# Patient Record
Sex: Male | Born: 1980 | Race: White | Hispanic: No | Marital: Single | Smoking: Never smoker
Health system: Southern US, Community
[De-identification: ages and names within clinical notes are randomized; demographics above are authoritative.]

## PROBLEM LIST (undated history)

## (undated) DIAGNOSIS — F1010 Alcohol abuse, uncomplicated: Secondary | ICD-10-CM

## (undated) DIAGNOSIS — M542 Cervicalgia: Secondary | ICD-10-CM

## (undated) DIAGNOSIS — B1920 Unspecified viral hepatitis C without hepatic coma: Secondary | ICD-10-CM

## (undated) DIAGNOSIS — Z720 Tobacco use: Secondary | ICD-10-CM

## (undated) DIAGNOSIS — G8929 Other chronic pain: Secondary | ICD-10-CM

## (undated) DIAGNOSIS — F1910 Other psychoactive substance abuse, uncomplicated: Secondary | ICD-10-CM

---

## 2016-11-01 ENCOUNTER — Inpatient Hospital Stay (HOSPITAL_COMMUNITY)
Admission: EM | Admit: 2016-11-01 | Discharge: 2016-11-04 | DRG: 918 | Disposition: A | Discharge status: Other Acute Inpatient Hospital | Payer: Federal, State, Local not specified - Other | Attending: Internal Medicine | Admitting: Internal Medicine

## 2016-11-01 ENCOUNTER — Encounter (HOSPITAL_COMMUNITY): Payer: Self-pay

## 2016-11-01 DIAGNOSIS — R001 Bradycardia, unspecified: Secondary | ICD-10-CM | POA: Diagnosis present

## 2016-11-01 DIAGNOSIS — T391X1A Poisoning by 4-Aminophenol derivatives, accidental (unintentional), initial encounter: Secondary | ICD-10-CM | POA: Diagnosis present

## 2016-11-01 DIAGNOSIS — T391X2A Poisoning by 4-Aminophenol derivatives, intentional self-harm, initial encounter: Principal | ICD-10-CM | POA: Diagnosis present

## 2016-11-01 DIAGNOSIS — Z720 Tobacco use: Secondary | ICD-10-CM | POA: Diagnosis present

## 2016-11-01 DIAGNOSIS — F1010 Alcohol abuse, uncomplicated: Secondary | ICD-10-CM | POA: Diagnosis present

## 2016-11-01 DIAGNOSIS — F1210 Cannabis abuse, uncomplicated: Secondary | ICD-10-CM | POA: Diagnosis present

## 2016-11-01 DIAGNOSIS — T50901A Poisoning by unspecified drugs, medicaments and biological substances, accidental (unintentional), initial encounter: Secondary | ICD-10-CM | POA: Diagnosis present

## 2016-11-01 DIAGNOSIS — T402X2A Poisoning by other opioids, intentional self-harm, initial encounter: Secondary | ICD-10-CM | POA: Diagnosis present

## 2016-11-01 DIAGNOSIS — F17200 Nicotine dependence, unspecified, uncomplicated: Secondary | ICD-10-CM | POA: Diagnosis present

## 2016-11-01 DIAGNOSIS — T50902A Poisoning by unspecified drugs, medicaments and biological substances, intentional self-harm, initial encounter: Secondary | ICD-10-CM

## 2016-11-01 DIAGNOSIS — R7401 Elevation of levels of liver transaminase levels: Secondary | ICD-10-CM

## 2016-11-01 DIAGNOSIS — F1410 Cocaine abuse, uncomplicated: Secondary | ICD-10-CM | POA: Diagnosis present

## 2016-11-01 DIAGNOSIS — F329 Major depressive disorder, single episode, unspecified: Secondary | ICD-10-CM | POA: Diagnosis present

## 2016-11-01 DIAGNOSIS — B1920 Unspecified viral hepatitis C without hepatic coma: Secondary | ICD-10-CM | POA: Diagnosis present

## 2016-11-01 DIAGNOSIS — R7989 Other specified abnormal findings of blood chemistry: Secondary | ICD-10-CM | POA: Diagnosis present

## 2016-11-01 DIAGNOSIS — F1910 Other psychoactive substance abuse, uncomplicated: Secondary | ICD-10-CM | POA: Diagnosis present

## 2016-11-01 DIAGNOSIS — R740 Nonspecific elevation of levels of transaminase and lactic acid dehydrogenase [LDH]: Secondary | ICD-10-CM

## 2016-11-01 DIAGNOSIS — R4589 Other symptoms and signs involving emotional state: Secondary | ICD-10-CM | POA: Diagnosis present

## 2016-11-01 DIAGNOSIS — R945 Abnormal results of liver function studies: Secondary | ICD-10-CM | POA: Diagnosis present

## 2016-11-01 DIAGNOSIS — R4689 Other symptoms and signs involving appearance and behavior: Secondary | ICD-10-CM

## 2016-11-01 HISTORY — DX: Other psychoactive substance abuse, uncomplicated: F19.10

## 2016-11-01 HISTORY — DX: Tobacco use: Z72.0

## 2016-11-01 HISTORY — DX: Unspecified viral hepatitis C without hepatic coma: B19.20

## 2016-11-01 HISTORY — DX: Alcohol abuse, uncomplicated: F10.10

## 2016-11-01 LAB — SALICYLATE LEVEL: Salicylate Lvl: <7 mg/dL (ref 2.8–30.0)

## 2016-11-01 LAB — COMPREHENSIVE METABOLIC PANEL
ALT: 305 U/L — ABNORMAL HIGH (ref 17–63)
AST: 348 U/L — ABNORMAL HIGH (ref 15–41)
Albumin: 4 g/dL (ref 3.5–5.0)
Alkaline Phosphatase: 63 U/L (ref 38–126)
Anion gap: 8 (ref 5–15)
BUN: 7 mg/dL (ref 6–20)
CO2: 31 mmol/L (ref 22–32)
Calcium: 9.3 mg/dL (ref 8.9–10.3)
Chloride: 98 mmol/L — ABNORMAL LOW (ref 101–111)
Creatinine, Ser: 0.95 mg/dL (ref 0.61–1.24)
GFR calc Af Amer: >60 mL/min (ref >60)
GFR calc non Af Amer: >60 mL/min (ref >60)
Glucose, Bld: 91 mg/dL (ref 65–99)
Potassium: 3.9 mmol/L (ref 3.5–5.1)
Sodium: 137 mmol/L (ref 135–145)
Total Bilirubin: 1.3 mg/dL — ABNORMAL HIGH (ref 0.3–1.2)
Total Protein: 6.2 g/dL — ABNORMAL LOW (ref 6.5–8.1)

## 2016-11-01 LAB — CBC
HCT: 43.5 % (ref 39.0–52.0)
Hemoglobin: 14.3 g/dL (ref 13.0–17.0)
MCH: 29.3 pg (ref 26.0–34.0)
MCHC: 32.9 g/dL (ref 30.0–36.0)
MCV: 89.1 fL (ref 78.0–100.0)
Platelets: 206 10*3/uL (ref 150–400)
RBC: 4.88 MIL/uL (ref 4.22–5.81)
RDW: 12.3 % (ref 11.5–15.5)
WBC: 5.8 10*3/uL (ref 4.0–10.5)

## 2016-11-01 LAB — RAPID URINE DRUG SCREEN, HOSP PERFORMED
Amphetamines: NOT DETECTED
Barbiturates: NOT DETECTED
Benzodiazepines: POSITIVE — AB
Cocaine: POSITIVE — AB
Opiates: NOT DETECTED
Tetrahydrocannabinol: POSITIVE — AB

## 2016-11-01 LAB — ETHANOL: Alcohol, Ethyl (B): <5 mg/dL (ref <5)

## 2016-11-01 LAB — ACETAMINOPHEN LEVEL: Acetaminophen (Tylenol), Serum: <10 ug/mL — ABNORMAL LOW (ref 10–30)

## 2016-11-01 LAB — CK: Total CK: 52 U/L (ref 49–397)

## 2016-11-01 NOTE — ED Notes (Signed)
ED Provider at bedside. 

## 2016-11-01 NOTE — ED Notes (Signed)
Staffing called for sitter and security called to be wanded

## 2016-11-01 NOTE — ED Provider Notes (Signed)
[facility]-EMERGENCY DEPT Provider Note   CSN: [id number] Arrival date & time: 11/01/16  1831  By signing my name below, I, George Quinn, attest that this documentation has been prepared under the direction and in the presence of George Palumbo, MD  Electronically Signed: Thelma BargeNick Quinn, Scribe. 11/01/16. 11:29 PM.  History   Chief Complaint Chief Complaint  Patient presents with  . Drug Overdose   The history is provided by the patient. No language interpreter was used.  Ingestion  This is a new problem. The current episode started more than 2 days ago. The problem occurs constantly. The problem has been gradually improving. Pertinent negatives include no chest pain and no shortness of breath. Nothing aggravates the symptoms. Nothing relieves the symptoms. He has tried nothing for the symptoms. The treatment provided significant relief.    HPI Comments: George Quinn is a 36 y.o. male who presents to the Emergency Department complaining of a drug overdose that occurred on 10-27-16. He states he took a 20 pills of percocet 5mg  and an entire bottle of generic sleeping pills. He is requesting to know if the medications have left his system. He states he fell asleep and just woke up today in a ditch and reports bug bites to his legs. He denies vomiting. History reviewed. No pertinent past medical history.  There are no active problems to display for this patient.   History reviewed. No pertinent surgical history.     Home Medications    Prior to Admission medications   Not on File    Family History No family history on file.  Social History Social History  Substance Use Topics  . Smoking status: Current Every Day Smoker  . Smokeless tobacco: Never Used  . Alcohol use Yes     Allergies   Patient has no known allergies.   Review of Systems Review of Systems  Constitutional: Negative for appetite change, chills and fever.  HENT: Negative for drooling and facial swelling.     Eyes: Negative for photophobia.  Respiratory: Negative for shortness of breath.   Cardiovascular: Negative for chest pain, palpitations and leg swelling.  Gastrointestinal: Negative for anal bleeding and vomiting.  Genitourinary: Negative for difficulty urinating.  Musculoskeletal: Negative for neck stiffness.  Skin: Negative for pallor.  Neurological: Negative for facial asymmetry and speech difficulty.  Psychiatric/Behavioral: Positive for behavioral problems, self-injury and suicidal ideas.  All other systems reviewed and are negative.    Physical Exam Updated Vital Signs BP (!) 152/71 (BP Location: Right Arm)   Pulse (!) 46   Temp 98.1 F (36.7 C) (Oral)   Resp 20   SpO2 99%   Physical Exam  Constitutional: He is oriented to person, place, and time. He appears well-developed and well-nourished.  HENT:  Head: Normocephalic and atraumatic.  Nose: Nose normal.  Eyes: EOM are normal.  Neck: Normal range of motion. Neck supple.  Cardiovascular: Normal rate, regular rhythm, normal heart sounds and intact distal pulses.  Exam reveals no gallop and no friction rub.   No murmur heard. Pulmonary/Chest: Effort normal and breath sounds normal. No respiratory distress. He has no wheezes. He has no rales.  Abdominal: Soft. Bowel sounds are normal. He exhibits no distension. There is no tenderness. There is no rebound and no guarding.  Musculoskeletal: Normal range of motion.  Neurological: He is alert and oriented to person, place, and time.  Skin: Skin is warm and dry. Capillary refill takes less than 2 seconds.  No urine burns Skin warm,  dry, intact, all compartments soft   Psychiatric: He has a normal mood and affect. Judgment normal.  Nursing note and vitals reviewed.    ED Treatments / Results  DIAGNOSTIC STUDIES: Oxygen Saturation is 99% on RA, normal by my interpretation.    COORDINATION OF CARE: 11:28 PM Discussed treatment plan with pt at bedside and pt agreed to  plan.  @1 :25 am pt will be admitted to Dr. Clyde LundborgNiu. Labs (all labs ordered are listed, but only abnormal results are displayed)  Results for orders placed or performed during the hospital encounter of 11/01/16  Comprehensive metabolic panel  Result Value Ref Range   Sodium 137 135 - 145 mmol/L   Potassium 3.9 3.5 - 5.1 mmol/L   Chloride 98 (L) 101 - 111 mmol/L   CO2 31 22 - 32 mmol/L   Glucose, Bld 91 65 - 99 mg/dL   BUN 7 6 - 20 mg/dL   Creatinine, Ser 1.610.95 0.61 - 1.24 mg/dL   Calcium 9.3 8.9 - 09.610.3 mg/dL   Total Protein 6.2 (L) 6.5 - 8.1 g/dL   Albumin 4.0 3.5 - 5.0 g/dL   AST 045348 (H) 15 - 41 U/L   ALT 305 (H) 17 - 63 U/L   Alkaline Phosphatase 63 38 - 126 U/L   Total Bilirubin 1.3 (H) 0.3 - 1.2 mg/dL   GFR calc non Af Amer >60 >60 mL/min   GFR calc Af Amer >60 >60 mL/min   Anion gap 8 5 - 15  Ethanol  Result Value Ref Range   Alcohol, Ethyl (B) <5 <5 mg/dL  Salicylate level  Result Value Ref Range   Salicylate Lvl <7.0 2.8 - 30.0 mg/dL  Acetaminophen level  Result Value Ref Range   Acetaminophen (Tylenol), Serum <10 (L) 10 - 30 ug/mL  cbc  Result Value Ref Range   WBC 5.8 4.0 - 10.5 K/uL   RBC 4.88 4.22 - 5.81 MIL/uL   Hemoglobin 14.3 13.0 - 17.0 g/dL   HCT 40.943.5 81.139.0 - 91.452.0 %   MCV 89.1 78.0 - 100.0 fL   MCH 29.3 26.0 - 34.0 pg   MCHC 32.9 30.0 - 36.0 g/dL   RDW 78.212.3 95.611.5 - 21.315.5 %   Platelets 206 150 - 400 K/uL  Rapid urine drug screen (hospital performed)  Result Value Ref Range   Opiates NONE DETECTED NONE DETECTED   Cocaine POSITIVE (A) NONE DETECTED   Benzodiazepines POSITIVE (A) NONE DETECTED   Amphetamines NONE DETECTED NONE DETECTED   Tetrahydrocannabinol POSITIVE (A) NONE DETECTED   Barbiturates NONE DETECTED NONE DETECTED  CK  Result Value Ref Range   Total CK 52 49 - 397 U/L   No results found.  EKG  EKG Interpretation  Date/Time:  Wednesday November 01 2016 23:30:56 EDT Ventricular Rate:  52 PR Interval:    QRS Duration: 90 QT  Interval:  425 QTC Calculation: 396 R Axis:   99 Text Interpretation:  Sinus rhythm Confirmed by Nicanor AlconPalumbo, George ([id number]) on 11/01/2016 11:57:13 PM       Radiology No results found.  Procedures Procedures (including critical care time)  Medications Ordered in ED  Medications  acetylcysteine (ACETADOTE) 40 mg/mL load via infusion 9,525 mg (not administered)    Followed by  acetylcysteine (ACETADOTE) 40,000 mg in dextrose 5 % 1,000 mL (40 mg/mL) infusion (not administered)  0.9 %  sodium chloride infusion (not administered)  ondansetron (ZOFRAN) injection 4 mg (not administered)  zolpidem (AMBIEN) tablet 5 mg (not administered)  nicotine (NICODERM  CQ - dosed in mg/24 hours) patch 21 mg (not administered)  LORazepam (ATIVAN) tablet 1 mg (not administered)    Or  LORazepam (ATIVAN) injection 1 mg (not administered)  thiamine (VITAMIN B-1) tablet 100 mg (not administered)    Or  thiamine (B-1) injection 100 mg (not administered)  folic acid (FOLVITE) tablet 1 mg (not administered)  multivitamin with minerals tablet 1 tablet (not administered)  LORazepam (ATIVAN) injection 0-4 mg (not administered)    Followed by  LORazepam (ATIVAN) injection 0-4 mg (not administered)  enoxaparin (LOVENOX) injection 40 mg (not administered)     Final Clinical Impressions(s) / ED Diagnoses  Intentional overdose: will need to see psychiatry once medically cleared on NAC in 24 hours.    I personally performed the services described in this documentation, which was scribed in my presence. The recorded information has been reviewed and is accurate.      Quinn, April, MD 11/02/16 [phone]

## 2016-11-01 NOTE — ED Triage Notes (Signed)
Pt reports that he "thinks" Friday night he took a bottle of percocet and followed it with a fifth of vodka and he woke up yesterday morning and called [facility] they told him to come here to make sure that he does not have anymore drugs in his system. The patient denies any use since then. Alert oriented and ambulatory. Denies any symptoms at all. Confesses to SI Friday denies any SI at this time.

## 2016-11-02 ENCOUNTER — Encounter (HOSPITAL_COMMUNITY): Payer: Self-pay | Admitting: Internal Medicine

## 2016-11-02 ENCOUNTER — Inpatient Hospital Stay (HOSPITAL_COMMUNITY): Payer: Self-pay

## 2016-11-02 DIAGNOSIS — F1910 Other psychoactive substance abuse, uncomplicated: Secondary | ICD-10-CM

## 2016-11-02 DIAGNOSIS — T50902A Poisoning by unspecified drugs, medicaments and biological substances, intentional self-harm, initial encounter: Secondary | ICD-10-CM

## 2016-11-02 DIAGNOSIS — T391X2A Poisoning by 4-Aminophenol derivatives, intentional self-harm, initial encounter: Principal | ICD-10-CM

## 2016-11-02 DIAGNOSIS — R945 Abnormal results of liver function studies: Secondary | ICD-10-CM | POA: Diagnosis present

## 2016-11-02 DIAGNOSIS — B1920 Unspecified viral hepatitis C without hepatic coma: Secondary | ICD-10-CM

## 2016-11-02 DIAGNOSIS — F1010 Alcohol abuse, uncomplicated: Secondary | ICD-10-CM

## 2016-11-02 DIAGNOSIS — R4589 Other symptoms and signs involving emotional state: Secondary | ICD-10-CM | POA: Diagnosis present

## 2016-11-02 DIAGNOSIS — R7989 Other specified abnormal findings of blood chemistry: Secondary | ICD-10-CM | POA: Diagnosis present

## 2016-11-02 DIAGNOSIS — Z720 Tobacco use: Secondary | ICD-10-CM

## 2016-11-02 DIAGNOSIS — R001 Bradycardia, unspecified: Secondary | ICD-10-CM

## 2016-11-02 DIAGNOSIS — T391X1A Poisoning by 4-Aminophenol derivatives, accidental (unintentional), initial encounter: Secondary | ICD-10-CM | POA: Diagnosis present

## 2016-11-02 DIAGNOSIS — R4689 Other symptoms and signs involving appearance and behavior: Secondary | ICD-10-CM

## 2016-11-02 DIAGNOSIS — T50901A Poisoning by unspecified drugs, medicaments and biological substances, accidental (unintentional), initial encounter: Secondary | ICD-10-CM | POA: Diagnosis present

## 2016-11-02 DIAGNOSIS — T1491XA Suicide attempt, initial encounter: Secondary | ICD-10-CM

## 2016-11-02 LAB — HIV ANTIBODY (ROUTINE TESTING W REFLEX): HIV Screen 4th Generation wRfx: NONREACTIVE

## 2016-11-02 LAB — CBC
HCT: 41.3 % (ref 39.0–52.0)
Hemoglobin: 14.2 g/dL (ref 13.0–17.0)
MCH: 29.8 pg (ref 26.0–34.0)
MCHC: 34.4 g/dL (ref 30.0–36.0)
MCV: 86.6 fL (ref 78.0–100.0)
Platelets: 169 10*3/uL (ref 150–400)
RBC: 4.77 MIL/uL (ref 4.22–5.81)
RDW: 11.9 % (ref 11.5–15.5)
WBC: 5.2 10*3/uL (ref 4.0–10.5)

## 2016-11-02 LAB — COMPREHENSIVE METABOLIC PANEL
ALT: 297 U/L — ABNORMAL HIGH (ref 17–63)
AST: 335 U/L — ABNORMAL HIGH (ref 15–41)
Albumin: 3.8 g/dL (ref 3.5–5.0)
Alkaline Phosphatase: 64 U/L (ref 38–126)
Anion gap: 6 (ref 5–15)
BUN: 9 mg/dL (ref 6–20)
CO2: 29 mmol/L (ref 22–32)
Calcium: 8.9 mg/dL (ref 8.9–10.3)
Chloride: 104 mmol/L (ref 101–111)
Creatinine, Ser: 0.85 mg/dL (ref 0.61–1.24)
GFR calc Af Amer: >60 mL/min (ref >60)
GFR calc non Af Amer: >60 mL/min (ref >60)
Glucose, Bld: 102 mg/dL — ABNORMAL HIGH (ref 65–99)
Potassium: 3.6 mmol/L (ref 3.5–5.1)
Sodium: 139 mmol/L (ref 135–145)
Total Bilirubin: 0.6 mg/dL (ref 0.3–1.2)
Total Protein: 5.8 g/dL — ABNORMAL LOW (ref 6.5–8.1)

## 2016-11-02 LAB — PROTIME-INR
INR: 1.08
Prothrombin Time: 14 seconds (ref 11.4–15.2)

## 2016-11-02 LAB — APTT: aPTT: 28 seconds (ref 24–36)

## 2016-11-02 LAB — ACETAMINOPHEN LEVEL: Acetaminophen (Tylenol), Serum: <10 ug/mL — ABNORMAL LOW (ref 10–30)

## 2016-11-02 MED ORDER — ACETYLCYSTEINE LOAD VIA INFUSION
150.0000 mg/kg | Freq: Once | INTRAVENOUS | Status: DC
  Filled 2016-11-02: qty 239

## 2016-11-02 MED ORDER — VITAMIN B-1 100 MG PO TABS
100.0000 mg | ORAL_TABLET | Freq: Every day | ORAL | Status: DC
  Administered 2016-11-02 – 2016-11-04 (×3): 100 mg via ORAL
  Filled 2016-11-02 (×3): qty 1

## 2016-11-02 MED ORDER — LORAZEPAM 1 MG PO TABS: 1.0000 mg | ORAL_TABLET | Freq: Four times a day (QID) | ORAL | Status: DC | PRN

## 2016-11-02 MED ORDER — ADULT MULTIVITAMIN W/MINERALS CH
1.0000 | ORAL_TABLET | Freq: Every day | ORAL | Status: DC
  Administered 2016-11-02 – 2016-11-04 (×3): 1 via ORAL
  Filled 2016-11-02 (×3): qty 1

## 2016-11-02 MED ORDER — DEXTROSE 5 % IV SOLN
15.0000 mg/kg/h | INTRAVENOUS | Status: DC
  Administered 2016-11-02 – 2016-11-03 (×2): 15 mg/kg/h via INTRAVENOUS
  Filled 2016-11-02 (×2): qty 150

## 2016-11-02 MED ORDER — FOLIC ACID 1 MG PO TABS
1.0000 mg | ORAL_TABLET | Freq: Every day | ORAL | Status: DC
  Administered 2016-11-02 – 2016-11-04 (×3): 1 mg via ORAL
  Filled 2016-11-02 (×3): qty 1

## 2016-11-02 MED ORDER — LORAZEPAM 2 MG/ML IJ SOLN
0.0000 mg | Freq: Four times a day (QID) | INTRAMUSCULAR | Status: DC
Start: 2016-11-02 — End: 2016-11-02
  Administered 2016-11-02: 2 mg via INTRAVENOUS
  Administered 2016-11-02: 1 mg via INTRAVENOUS
  Filled 2016-11-02: qty 1

## 2016-11-02 MED ORDER — ADULT MULTIVITAMIN W/MINERALS CH: 1.0000 | ORAL_TABLET | Freq: Every day | ORAL | Status: DC

## 2016-11-02 MED ORDER — LORAZEPAM 2 MG/ML IJ SOLN
1.0000 mg | Freq: Four times a day (QID) | INTRAMUSCULAR | Status: DC | PRN
  Filled 2016-11-02: qty 1

## 2016-11-02 MED ORDER — FOLIC ACID 1 MG PO TABS: 1.0000 mg | ORAL_TABLET | Freq: Every day | ORAL | Status: DC

## 2016-11-02 MED ORDER — SODIUM CHLORIDE 0.9 % IV SOLN
INTRAVENOUS | Status: DC
Start: 2016-11-02 — End: 2016-11-04
  Administered 2016-11-02: 03:00:00 via INTRAVENOUS
  Administered 2016-11-03: 50 mL/h via INTRAVENOUS

## 2016-11-02 MED ORDER — ZOLPIDEM TARTRATE 5 MG PO TABS: 5.0000 mg | ORAL_TABLET | Freq: Every evening | ORAL | Status: DC | PRN

## 2016-11-02 MED ORDER — LORAZEPAM 2 MG/ML IJ SOLN
1.0000 mg | Freq: Four times a day (QID) | INTRAMUSCULAR | Status: DC | PRN
  Administered 2016-11-04: 1 mg via INTRAVENOUS
  Filled 2016-11-02: qty 1

## 2016-11-02 MED ORDER — DEXTROSE 5 % IV SOLN
15.0000 mg/kg/h | INTRAVENOUS | Status: DC
  Filled 2016-11-02: qty 150

## 2016-11-02 MED ORDER — LORAZEPAM 1 MG PO TABS
1.0000 mg | ORAL_TABLET | Freq: Four times a day (QID) | ORAL | Status: DC | PRN
  Administered 2016-11-02: 1 mg via ORAL
  Filled 2016-11-02: qty 1

## 2016-11-02 MED ORDER — LORAZEPAM 2 MG/ML IJ SOLN: 0.0000 mg | Freq: Two times a day (BID) | INTRAMUSCULAR | Status: DC

## 2016-11-02 MED ORDER — DEXTROSE 5 % IV SOLN
15.0000 mg/kg/h | INTRAVENOUS | Status: DC
  Filled 2016-11-02: qty 200

## 2016-11-02 MED ORDER — NICOTINE 21 MG/24HR TD PT24
21.0000 mg | MEDICATED_PATCH | Freq: Every day | TRANSDERMAL | Status: DC
  Filled 2016-11-02 (×2): qty 1

## 2016-11-02 MED ORDER — ONDANSETRON HCL 4 MG/2ML IJ SOLN: 4.0000 mg | Freq: Three times a day (TID) | INTRAMUSCULAR | Status: DC | PRN

## 2016-11-02 MED ORDER — ENOXAPARIN SODIUM 40 MG/0.4ML ~~LOC~~ SOLN
40.0000 mg | SUBCUTANEOUS | Status: DC
  Filled 2016-11-02: qty 0.4

## 2016-11-02 MED ORDER — VITAMIN B-1 100 MG PO TABS: 100.0000 mg | ORAL_TABLET | Freq: Every day | ORAL | Status: DC

## 2016-11-02 MED ORDER — THIAMINE HCL 100 MG/ML IJ SOLN: 100.0000 mg | Freq: Every day | INTRAMUSCULAR | Status: DC

## 2016-11-02 MED ORDER — ACETYLCYSTEINE LOAD VIA INFUSION
150.0000 mg/kg | Freq: Once | INTRAVENOUS | Status: AC
  Administered 2016-11-02: 8865 mg via INTRAVENOUS
  Filled 2016-11-02: qty 222

## 2016-11-02 NOTE — H&P (Signed)
History and Physical    George Quinn WUJ:[id number] DOB: 03/07/1981 DOA: 11/01/2016  Referring MD/NP/PA:   PCP: Patient, No Pcp Per   Patient coming from:  The patient is coming from home.  At baseline, pt is independent for most of ADL.  Chief Complaint: overdose of Percocet  HPI: George Quinn is a 36 y.o. male with medical history significant of polysubstance abuse including tobacco, alcohol, cocaine, marijuana and benzodiazepine, who presents with overdose of Percocet.  Pt states that he took 20 pills of percocet 5 mg and an entire bottle of generic sleeping pills no 10/27/16. He also had fifth of vodka. He states he fell asleep and just woke up yesterday morning. He called [facility], and was told to come to ED to make sure that he does not have anymore drugs in his system. Confesses to SI Friday, but denies any SI or HI at this time. Patient is asymptomatic. He denies chest pain, shortness breath, cough, fever, chills, nausea, vomiting, diarrhea, abdominal pain, symptoms of UTI or unilateral weakness.  ED Course: pt was found to have Tylenol level less than 10, salicylate level less than 7, CK 52, positive UDS for benzodiazepine, cocaine and THC, WBC 5.8, abnormal liver function with AST 348, ALT 305, total bilirubin 1.3, ALP 63, electrolytes renal function okay, temperature normal, bradycardia, oxygen saturation 95% on room air. Patient is placed on telemetry bed for observation. IV acetylcysteine was started per poison control recommendation  Review of Systems:   General: no fevers, chills, no changes in body weight, has fatigue HEENT: no blurry vision, hearing changes or sore throat Respiratory: no dyspnea, coughing, wheezing CV: no chest pain, no palpitations GI: no nausea, vomiting, abdominal pain, diarrhea, constipation GU: no dysuria, burning on urination, increased urinary frequency, hematuria  Ext: no leg edema Neuro: no unilateral weakness, numbness, or tingling, no  vision change or hearing loss Skin: no rash, no skin tear. MSK: No muscle spasm, no deformity, no limitation of range of movement in spin Heme: No easy bruising.  Travel history: No recent long distant travel.  Allergy: No Known Allergies  Past Medical History:  Diagnosis Date  . Alcohol abuse   . HCV (hepatitis C virus)   . Hepatitis C   . Polysubstance abuse   . Tobacco abuse     History reviewed. No pertinent surgical history.  Social History:  reports that he has been smoking.  He has never used smokeless tobacco. He reports that he drinks alcohol. He reports that he uses drugs, including Cocaine and Marijuana.  Family History:  Family History  Problem Relation Age of Onset  . Diabetes Mellitus II Mother   . Thyroid disease Mother      Prior to Admission medications   Not on File    Physical Exam: Vitals:   11/02/16 0030 11/02/16 0106 11/02/16 0257 11/02/16 0335  BP: (!) 132/92  (!) 137/104   Pulse: (!) 46  (!) 45   Resp: 14  15 18   Temp:   97.6 F (36.4 C)   TempSrc:   Oral   SpO2: 96%  98%   Weight:  63.5 kg (140 lb) 59.1 kg (130 lb 4.8 oz)   Height:   5\' 7"  (1.702 m)    General: Not in acute distress. HEENT:       Eyes: PERRL, EOMI, no scleral icterus.       ENT: No discharge from the ears and nose, no pharynx injection, no tonsillar enlargement.  Neck: No JVD, no bruit, no mass felt. Heme: No neck lymph node enlargement. Cardiac: S1/S2, RRR, No murmurs, No gallops or rubs. Respiratory: No rales, wheezing, rhonchi or rubs. GI: Soft, nondistended, nontender, no rebound pain, no organomegaly, BS present. GU: No hematuria Ext: No pitting leg edema bilaterally. 2+DP/PT pulse bilaterally. Musculoskeletal: No joint deformities, No joint redness or warmth, no limitation of ROM in spin. Skin: No rashes.  Neuro: Alert, oriented X3, cranial nerves II-XII grossly intact, moves all extremities normally.  Psych: Patient is not psychotic, had suicidal  ideation. No HI  Labs on Admission: I have personally reviewed following labs and imaging studies  CBC:  Recent Labs Lab 11/01/16 1911 11/02/16 0203  WBC 5.8 5.2  HGB 14.3 14.2  HCT 43.5 41.3  MCV 89.1 86.6  PLT 206 169   Basic Metabolic Panel:  Recent Labs Lab 11/01/16 1911 11/02/16 0137  NA 137 139  K 3.9 3.6  CL 98* 104  CO2 31 29  GLUCOSE 91 102*  BUN 7 9  CREATININE 0.95 0.85  CALCIUM 9.3 8.9   GFR: Estimated Creatinine Clearance: 102.4 mL/min (by C-G formula based on SCr of 0.85 mg/dL). Liver Function Tests:  Recent Labs Lab 11/01/16 1911 11/02/16 0137  AST 348* 335*  ALT 305* 297*  ALKPHOS 63 64  BILITOT 1.3* 0.6  PROT 6.2* 5.8*  ALBUMIN 4.0 3.8   No results for input(s): LIPASE, AMYLASE in the last 168 hours. No results for input(s): AMMONIA in the last 168 hours. Coagulation Profile:  Recent Labs Lab 11/02/16 0137  INR 1.08   Cardiac Enzymes:  Recent Labs Lab 11/01/16 1911  CKTOTAL 52   BNP (last 3 results) No results for input(s): PROBNP in the last 8760 hours. HbA1C: No results for input(s): HGBA1C in the last 72 hours. CBG: No results for input(s): GLUCAP in the last 168 hours. Lipid Profile: No results for input(s): CHOL, HDL, LDLCALC, TRIG, CHOLHDL, LDLDIRECT in the last 72 hours. Thyroid Function Tests: No results for input(s): TSH, T4TOTAL, FREET4, T3FREE, THYROIDAB in the last 72 hours. Anemia Panel: No results for input(s): VITAMINB12, FOLATE, FERRITIN, TIBC, IRON, RETICCTPCT in the last 72 hours. Urine analysis: No results found for: COLORURINE, APPEARANCEUR, LABSPEC, PHURINE, GLUCOSEU, HGBUR, BILIRUBINUR, KETONESUR, PROTEINUR, UROBILINOGEN, NITRITE, LEUKOCYTESUR Sepsis Labs: @LABRCNTIP (procalcitonin:4,lacticidven:4) )No results found for this or any previous visit (from the past 240 hour(s)).   Radiological Exams on Admission: No results found.   EKG: Independently reviewed.  Sinus rhythm, QTC 396, right axis  deviation  Assessment/Plan Principal Problem:   Overdose by acetaminophen Active Problems:   Suicidal behavior   Abnormal LFTs   Tobacco abuse   Polysubstance abuse   Alcohol abuse   Bradycardia   Overdose by acetaminophen: Tylenol level less than 10. Per EDP, poison control recommended IV NAC for 24 hours.   -will place on tele bed for obs -IV acetylcysteine started -f/u LFT by CMP -check INR and PTT -IVF: 150 cc/h of NS  Abnormal LFTs: AST 348, ALT 305, total bilirubin 1.3, ALP 63. Not sure if this is solely due to tylenol overdose since pt states that he may have hx of HCV -check hepatitis panel and HIV ab  Suicidal behavior: -sitter at bedside -please call [facility] in AM  Polysubstance abuse:  including tobacco, alcohol, cocaine, marijuana and benzodiazepine; -Did counseling about importance of quitting substance use -Nicotine patch -CIWA protocol  Bradycardia: Heart rate 40-50s. Pt is asymptomatic. Hemodynamically stable. -Telemetry monitor   DVT ppx:SQ Lovenox Code  Status: Full code Family Communication: None at bed side.   Disposition Plan:  Anticipate discharge back to previous home environment Consults called:  none Admission status: Obs / tele    Date of Service 11/02/2016    Lorretta HarpIU, XILIN [facility] Pager [phone]  If 7PM-7AM, please contact night-coverage [email or web address] Password [email or web address] 11/02/2016, 4:55 AM

## 2016-11-02 NOTE — ED Notes (Addendum)
Pt just made this RN aware that he has a hx of Hepatitis C.

## 2016-11-02 NOTE — Progress Notes (Signed)
N-acetylcysteine Note  George Quinn is a 36 y.o. male admitted on 11/01/2016 for suspected tylenol overdose. Patient reports taking a bottle of Percocet last Friday. Acetaminophen level < 10.  Pharmacy has been consulted for Acetadote dosing. Provider has already contacted Poison Control and has requested that pharmacy monitor and dose Acetadote for the next 24 hours.   Plan: Acetadote 150 mg/kg x 1 hour (9525mg ), then 15 mg/kg/hr for the next 23 hours  Weight: 140 lb (63.5 kg)  Temp (24hrs), Avg:98.1 F (36.7 C), Min:98 F (36.7 C), Max:98.1 F (36.7 C)   Recent Labs Lab 11/01/16 1911  WBC 5.8  CREATININE 0.95    CrCl cannot be calculated (Unknown ideal weight.).    No Known Allergies   Thank you for allowing pharmacy to be a part of this patient's care.  Toniann Failony L Rudisill 11/02/2016 1:28 AM

## 2016-11-02 NOTE — ED Notes (Signed)
Pt apologized to this RN for acting irritable earlier. Pt stated that he came in today for psychiatric help. Pt requests something for alcohol withdrawal. MD aware. Pt calm and cooperative, watching tv with sitter at bedside.

## 2016-11-02 NOTE — Progress Notes (Signed)
New Admission Note:   Arrival Method: ED bed with ED nurse and suicide sitter Mental Orientation: A&O x4 Telemetry: initiated Pain: denies Tubes: 2 IV's, each arm Safety Measures: implemented, suicide and general precautions  Orders to be reviewed and implemented. Will continue to monitor the patient. Call light has been placed within reach and bed alarm has been activated.   Mar DaringJequetta Thomas, RN

## 2016-11-02 NOTE — ED Notes (Signed)
Poison control called and notified. MD Palumbo notified.

## 2016-11-02 NOTE — Progress Notes (Addendum)
[facility] PROGRESS NOTE  George Quinn ZOX:[id number] DOB: 01-29-1981 DOA: 11/01/2016  PCP: Patient, No Pcp Per  Brief History/Interval Summary: 36 year old Caucasian male with a past medical history of polysubstance abuse including tobacco, alcohol, cocaine, marijuana, benzodiazepine, presents with intentional drug overdose with Percocet. He took 20 tablets and also took an entire bottle of generic sleeping pills. Patient was found to have elevated LFTs. Tylenol level was less than 10. Patient was hospitalized for further management.  Reason for Visit: Intentional drug overdose  Consultants: Psychiatry  Procedures: None  Antibiotics: None  Subjective/Interval History: Patient somnolent, easily arousable. Not fully cooperative and unwilling to talk to me and answer questions. Denies any pain.  ROS: Denies any nausea or vomiting  Objective:  Vital Signs  Vitals:   11/02/16 0257 11/02/16 0335 11/02/16 0806 11/02/16 1155  BP: (!) 137/104  (!) 150/96 (!) 148/89  Pulse: (!) 45  (!) 56 (!) 54  Resp: 15 18  18   Temp: 97.6 F (36.4 C)  98.4 F (36.9 C) 98.4 F (36.9 C)  TempSrc: Oral  Oral Oral  SpO2: 98%  100% 99%  Weight: 59.1 kg (130 lb 4.8 oz)     Height: 5\' 7"  (1.702 m)       Intake/Output Summary (Last 24 hours) at 11/02/16 1319 Last data filed at 11/02/16 0945  Gross per 24 hour  Intake          1127.36 ml  Output              200 ml  Net           927.36 ml   Filed Weights   11/02/16 0106 11/02/16 0257  Weight: 63.5 kg (140 lb) 59.1 kg (130 lb 4.8 oz)    General appearance: Somnolent but easily arousable. Resp: clear to auscultation bilaterally Cardio: regular rate and rhythm, S1, S2 normal, no murmur, click, rub or gallop GI: soft, non-tender; bowel sounds normal; no masses,  no organomegaly Extremities: extremities normal, atraumatic, no cyanosis or edema Neurologic: No focal deficits  Lab Results:  Data Reviewed: I have personally reviewed  following labs and imaging studies  CBC:  Recent Labs Lab 11/01/16 1911 11/02/16 0203  WBC 5.8 5.2  HGB 14.3 14.2  HCT 43.5 41.3  MCV 89.1 86.6  PLT 206 169    Basic Metabolic Panel:  Recent Labs Lab 11/01/16 1911 11/02/16 0137  NA 137 139  K 3.9 3.6  CL 98* 104  CO2 31 29  GLUCOSE 91 102*  BUN 7 9  CREATININE 0.95 0.85  CALCIUM 9.3 8.9    GFR: Estimated Creatinine Clearance: 102.4 mL/min (by C-G formula based on SCr of 0.85 mg/dL).  Liver Function Tests:  Recent Labs Lab 11/01/16 1911 11/02/16 0137  AST 348* 335*  ALT 305* 297*  ALKPHOS 63 64  BILITOT 1.3* 0.6  PROT 6.2* 5.8*  ALBUMIN 4.0 3.8     Coagulation Profile:  Recent Labs Lab 11/02/16 0137  INR 1.08    Cardiac Enzymes:  Recent Labs Lab 11/01/16 1911  CKTOTAL 52     Radiology Studies: No results found.   Medications:  Scheduled: . enoxaparin (LOVENOX) injection  40 mg Subcutaneous Q24H  . folic acid  1 mg Oral Daily  . multivitamin with minerals  1 tablet Oral Daily  . nicotine  21 mg Transdermal Daily  . thiamine  100 mg Oral Daily   Continuous: . sodium chloride 150 mL/hr at 11/02/16 0456  . acetylcysteine 15  mg/kg/hr (11/02/16 0453)   ZOX:[id number]:LORazepam **OR** LORazepam, ondansetron, zolpidem  Assessment/Plan:  Principal Problem:   Overdose by acetaminophen Active Problems:   Suicidal behavior   Abnormal LFTs   Tobacco abuse   Polysubstance abuse   Alcohol abuse   Bradycardia    Intentional drug overdose. Patient took about 20 tablets of Percocet. Tylenol level has been less than 10, but his LFTs have been abnormal. Poison control recommends n-acetylcysteine , which is being continued. Continue to monitor LFTs. However, patient also mentions history of hepatitis C and abnormal LFTs could just reflect this. No previous labs in our system.  Suicidal behavior. Corporate investment bankerContinue sitter. Consult psychiatry.  Transaminitis/?h/o Hep c AST, ALT, bilirubin noted to be  elevated. No old labs available. Hepatitis panel pending. Also proceed with hepatobiliary ultrasound.  History of polysubstance abuse. This includes tobacco, alcohol, cocaine, marijuana and benzodiazepine. He will need to be seen by psychiatry. He was placed on CIWA protocol, however, has been extremely somnolent. We will change Ativan to as needed. Continue thiamine and multivitamins. Check HIV.  Bradycardia Asymptomatic. EKG shows sinus bradycardia. We will check TSH.   DVT Prophylaxis: Lovenox    Code Status: Full code  Family Communication: Discussed with the patient  Disposition Plan: Management as outlined above.    LOS: 0 days   Angel Medical CenterKRISHNAN,GOKUL  [facility] Pager [phone] 11/02/2016, 1:19 PM  If 7PM-7AM, please contact night-coverage at [email or web address], password [facility]

## 2016-11-03 DIAGNOSIS — F1914 Other psychoactive substance abuse with psychoactive substance-induced mood disorder: Secondary | ICD-10-CM

## 2016-11-03 DIAGNOSIS — R945 Abnormal results of liver function studies: Secondary | ICD-10-CM | POA: Diagnosis not present

## 2016-11-03 DIAGNOSIS — F1910 Other psychoactive substance abuse, uncomplicated: Secondary | ICD-10-CM | POA: Diagnosis not present

## 2016-11-03 DIAGNOSIS — T50902A Poisoning by unspecified drugs, medicaments and biological substances, intentional self-harm, initial encounter: Secondary | ICD-10-CM | POA: Diagnosis not present

## 2016-11-03 DIAGNOSIS — T391X1A Poisoning by 4-Aminophenol derivatives, accidental (unintentional), initial encounter: Secondary | ICD-10-CM

## 2016-11-03 DIAGNOSIS — R001 Bradycardia, unspecified: Secondary | ICD-10-CM | POA: Diagnosis not present

## 2016-11-03 LAB — COMPREHENSIVE METABOLIC PANEL
ALT: 341 U/L — ABNORMAL HIGH (ref 17–63)
AST: 328 U/L — ABNORMAL HIGH (ref 15–41)
Albumin: 3.1 g/dL — ABNORMAL LOW (ref 3.5–5.0)
Alkaline Phosphatase: 61 U/L (ref 38–126)
Anion gap: 7 (ref 5–15)
BUN: 7 mg/dL (ref 6–20)
CO2: 26 mmol/L (ref 22–32)
Calcium: 8.6 mg/dL — ABNORMAL LOW (ref 8.9–10.3)
Chloride: 107 mmol/L (ref 101–111)
Creatinine, Ser: 0.71 mg/dL (ref 0.61–1.24)
GFR calc Af Amer: >60 mL/min (ref >60)
GFR calc non Af Amer: >60 mL/min (ref >60)
Glucose, Bld: 99 mg/dL (ref 65–99)
Potassium: 3.7 mmol/L (ref 3.5–5.1)
Sodium: 140 mmol/L (ref 135–145)
Total Bilirubin: 0.5 mg/dL (ref 0.3–1.2)
Total Protein: 4.9 g/dL — ABNORMAL LOW (ref 6.5–8.1)

## 2016-11-03 LAB — PROTIME-INR
INR: 1.17
Prothrombin Time: 14.9 seconds (ref 11.4–15.2)

## 2016-11-03 LAB — HEPATITIS PANEL, ACUTE
HCV Ab: >11 s/co ratio — ABNORMAL HIGH (ref 0.0–0.9)
Hep A IgM: NEGATIVE
Hep B C IgM: NEGATIVE
Hepatitis B Surface Ag: NEGATIVE

## 2016-11-03 LAB — TSH: TSH: 0.344 u[IU]/mL — ABNORMAL LOW (ref 0.350–4.500)

## 2016-11-03 NOTE — Progress Notes (Signed)
Per handoff report, night shift RN stated poison control called this morning and recommended Acetylcysteine IV for another day.  Dr. Barnie DelG. Krishnan notified via text page.  Patient denies thoughts of harming himself this morning.  Suicide sitter at bedside

## 2016-11-03 NOTE — Progress Notes (Signed)
[facility] PROGRESS NOTE  George Quinn XBJ:[id number] DOB: 1980/07/30 DOA: 11/01/2016  PCP: Patient, No Pcp Per  Brief History/Interval Summary: 36 year old Caucasian male with a past medical history of polysubstance abuse including tobacco, alcohol, cocaine, marijuana, benzodiazepine, presents with intentional drug overdose with Percocet. He took 20 tablets and also took an entire bottle of generic sleeping pills. Patient was found to have elevated LFTs. Tylenol level was less than 10. Patient was hospitalized for further management.  Reason for Visit: Intentional drug overdose  Consultants: Psychiatry  Procedures: None  Antibiotics: None  Subjective/Interval History: Patient awake and alert this morning. He denies any nausea, vomiting, diarrhea. No chest pain, shortness of breath. Tolerating his diet.  ROS: Denies any headaches  Objective:  Vital Signs  Vitals:   11/03/16 0000 11/03/16 0525 11/03/16 0531 11/03/16 0910  BP: 124/76 140/88  136/76  Pulse: (!) 57 (!) 44  (!) 51  Resp: 18   18  Temp: 97.6 F (36.4 C) (!) 97.5 F (36.4 C)  97.8 F (36.6 C)  TempSrc: Oral Oral  Oral  SpO2: 98% 98%  100%  Weight:   61.7 kg (136 lb 1.6 oz)   Height:        Intake/Output Summary (Last 24 hours) at 11/03/16 1238 Last data filed at 11/03/16 1000  Gross per 24 hour  Intake          4147.57 ml  Output              600 ml  Net          3547.57 ml   Filed Weights   11/02/16 0106 11/02/16 0257 11/03/16 0531  Weight: 63.5 kg (140 lb) 59.1 kg (130 lb 4.8 oz) 61.7 kg (136 lb 1.6 oz)   Telemetry: Sinus bradycardia is noted.  General appearance: Awake, alert. No distress. Resp: Clear to auscultation bilaterally Cardio: S1, S2 is normal, regular. No S3, S4. No rubs, murmurs or bruit GI: Abdomen is soft. Nontender. Nondistended. Extremities: no edema Neurologic: No focal deficits  Lab Results:  Data Reviewed: I have personally reviewed following labs and imaging  studies  CBC:  Recent Labs Lab 11/01/16 1911 11/02/16 0203  WBC 5.8 5.2  HGB 14.3 14.2  HCT 43.5 41.3  MCV 89.1 86.6  PLT 206 169    Basic Metabolic Panel:  Recent Labs Lab 11/01/16 1911 11/02/16 0137 11/03/16 0302  NA 137 139 140  K 3.9 3.6 3.7  CL 98* 104 107  CO2 31 29 26   GLUCOSE 91 102* 99  BUN 7 9 7   CREATININE 0.95 0.85 0.71  CALCIUM 9.3 8.9 8.6*    GFR: Estimated Creatinine Clearance: 113.5 mL/min (by C-G formula based on SCr of 0.71 mg/dL).  Liver Function Tests:  Recent Labs Lab 11/01/16 1911 11/02/16 0137 11/03/16 0302  AST 348* 335* 328*  ALT 305* 297* 341*  ALKPHOS 63 64 61  BILITOT 1.3* 0.6 0.5  PROT 6.2* 5.8* 4.9*  ALBUMIN 4.0 3.8 3.1*     Coagulation Profile:  Recent Labs Lab 11/02/16 0137 11/03/16 0302  INR 1.08 1.17    Cardiac Enzymes:  Recent Labs Lab 11/01/16 1911  CKTOTAL 52     Radiology Studies: [place] Abdomen Limited Ruq  Result Date: 11/02/2016 CLINICAL DATA:  Transaminitis for 1 day. EXAM: ULTRASOUND ABDOMEN LIMITED RIGHT UPPER QUADRANT COMPARISON:  None. FINDINGS: Gallbladder: No gallstones visualized. There is mild pericholecystic fluid. No sonographic Murphy sign noted by sonographer. Common bile duct: Diameter: 2.3 mm Liver:  No focal lesion identified. Within normal limits in parenchymal echogenicity. IMPRESSION: Mild pericholecystic fluid. There is no sonographic Murphy sign or gallstones. Liver is normal. Electronically Signed   By: Sherian ReinWei-Chen  Lin M.D.   On: 11/02/2016 19:33     Medications:  Scheduled: . enoxaparin (LOVENOX) injection  40 mg Subcutaneous Q24H  . folic acid  1 mg Oral Daily  . multivitamin with minerals  1 tablet Oral Daily  . nicotine  21 mg Transdermal Daily  . thiamine  100 mg Oral Daily   Continuous: . sodium chloride 100 mL/hr at 11/02/16 1335   JXB:[id number]:LORazepam **OR** LORazepam, ondansetron, zolpidem  Assessment/Plan:  Principal Problem:   Overdose by acetaminophen Active  Problems:   Suicidal behavior   Abnormal LFTs   Tobacco abuse   Polysubstance abuse   Alcohol abuse   Bradycardia   Drug overdose    Intentional drug overdose. Patient took about 20 tablets of Percocet. Tylenol level has been less than 10, but his LFTs have been abnormal. Poison control recommended n-acetylcysteine , which is being continued. Rising control recommends continuing for additional 24 hours. LFTs remain elevated, but abnormal LFTs could be due to his history of hepatitis C and alcoholism rather than Tylenol toxicity. His Tylenol level has been less than 10. INR is normal.  Suicidal behavior. Corporate investment bankerContinue sitter. Psychiatry did see the patient today.  Transaminitis/?h/o Hep c AST, ALT, bilirubin noted to be elevated. No old labs available. Hepatitis panel reviewed. HCV antibody was greater than 11. We will add quantitative testing. No concerning changes noted on ultrasound.  History of polysubstance abuse. This includes tobacco, alcohol, cocaine, marijuana and benzodiazepine. Stable. No evidence for withdrawal. Continue CIWA protocol. Continue multivitamins and thiamine. HIV nonreactive.  Bradycardia Asymptomatic. EKG shows sinus bradycardia. TSH actually low at 0.344. No symptoms of hyperthyroidism. We will check a free T4 level.   DVT Prophylaxis: Lovenox    Code Status: Full code  Family Communication: Discussed with the patient  Disposition Plan: Management as outlined above.    LOS: 1 day   [facility] Eye CenterKRISHNAN,GOKUL  [facility] Pager [phone] 11/03/2016, 12:38 PM  If 7PM-7AM, please contact night-coverage at [email or web address], password [email or web address]
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
## 2016-11-03 NOTE — Progress Notes (Signed)
[facility]-poison control recommends another 24hr run of acetylcysteine due to AST/ALT elevation. ALT levels higher this morning. RN spoke to [email or web address] notified. [phone]

## 2016-11-03 NOTE — Progress Notes (Signed)
   Introduced chaplaincy services.  Will follow, as needed.  

## 2016-11-03 NOTE — Progress Notes (Signed)
Poison Control called for update report.  This morning's ALT and AST results given, NS @ 14600ml/hr, scheduled medications and no prn meds given, and plan of care of awaiting psychiatrist evaluation, and pt remains on suicide watch.

## 2016-11-03 NOTE — Progress Notes (Signed)
CM following for DCP, psychiatric Inpatient admission when medically cleared; B Shelba Flakehandler RN,MHA,BSN [phone]

## 2016-11-04 ENCOUNTER — Inpatient Hospital Stay (HOSPITAL_COMMUNITY)
Admission: AD | Admit: 2016-11-04 | Discharge: 2016-11-09 | DRG: 881 | Disposition: A | Discharge status: Home / Self Care | Payer: Federal, State, Local not specified - Other | Source: Intra-hospital | Attending: Psychiatry | Admitting: Psychiatry

## 2016-11-04 ENCOUNTER — Encounter (HOSPITAL_COMMUNITY): Payer: Self-pay

## 2016-11-04 DIAGNOSIS — T4272XA Poisoning by unspecified antiepileptic and sedative-hypnotic drugs, intentional self-harm, initial encounter: Secondary | ICD-10-CM | POA: Diagnosis not present

## 2016-11-04 DIAGNOSIS — F17210 Nicotine dependence, cigarettes, uncomplicated: Secondary | ICD-10-CM | POA: Diagnosis present

## 2016-11-04 DIAGNOSIS — F1410 Cocaine abuse, uncomplicated: Secondary | ICD-10-CM | POA: Diagnosis present

## 2016-11-04 DIAGNOSIS — F1920 Other psychoactive substance dependence, uncomplicated: Secondary | ICD-10-CM | POA: Diagnosis not present

## 2016-11-04 DIAGNOSIS — R001 Bradycardia, unspecified: Secondary | ICD-10-CM | POA: Diagnosis present

## 2016-11-04 DIAGNOSIS — F1010 Alcohol abuse, uncomplicated: Secondary | ICD-10-CM | POA: Diagnosis present

## 2016-11-04 DIAGNOSIS — T391X2A Poisoning by 4-Aminophenol derivatives, intentional self-harm, initial encounter: Secondary | ICD-10-CM | POA: Diagnosis not present

## 2016-11-04 DIAGNOSIS — T50902A Poisoning by unspecified drugs, medicaments and biological substances, intentional self-harm, initial encounter: Secondary | ICD-10-CM | POA: Diagnosis not present

## 2016-11-04 DIAGNOSIS — Z915 Personal history of self-harm: Secondary | ICD-10-CM | POA: Diagnosis not present

## 2016-11-04 DIAGNOSIS — F322 Major depressive disorder, single episode, severe without psychotic features: Secondary | ICD-10-CM | POA: Diagnosis not present

## 2016-11-04 DIAGNOSIS — F329 Major depressive disorder, single episode, unspecified: Principal | ICD-10-CM | POA: Diagnosis present

## 2016-11-04 DIAGNOSIS — R4689 Other symptoms and signs involving appearance and behavior: Secondary | ICD-10-CM | POA: Diagnosis not present

## 2016-11-04 DIAGNOSIS — F1210 Cannabis abuse, uncomplicated: Secondary | ICD-10-CM | POA: Diagnosis present

## 2016-11-04 DIAGNOSIS — T1491XA Suicide attempt, initial encounter: Secondary | ICD-10-CM | POA: Diagnosis not present

## 2016-11-04 DIAGNOSIS — F1910 Other psychoactive substance abuse, uncomplicated: Secondary | ICD-10-CM | POA: Diagnosis present

## 2016-11-04 DIAGNOSIS — T50902D Poisoning by unspecified drugs, medicaments and biological substances, intentional self-harm, subsequent encounter: Secondary | ICD-10-CM | POA: Diagnosis not present

## 2016-11-04 LAB — COMPREHENSIVE METABOLIC PANEL
ALT: 403 U/L — ABNORMAL HIGH (ref 17–63)
AST: 324 U/L — ABNORMAL HIGH (ref 15–41)
Albumin: 3.3 g/dL — ABNORMAL LOW (ref 3.5–5.0)
Alkaline Phosphatase: 59 U/L (ref 38–126)
Anion gap: 6 (ref 5–15)
BUN: 5 mg/dL — ABNORMAL LOW (ref 6–20)
CO2: 27 mmol/L (ref 22–32)
Calcium: 8.6 mg/dL — ABNORMAL LOW (ref 8.9–10.3)
Chloride: 107 mmol/L (ref 101–111)
Creatinine, Ser: 0.73 mg/dL (ref 0.61–1.24)
GFR calc Af Amer: >60 mL/min (ref >60)
GFR calc non Af Amer: >60 mL/min (ref >60)
Glucose, Bld: 93 mg/dL (ref 65–99)
Potassium: 3.7 mmol/L (ref 3.5–5.1)
Sodium: 140 mmol/L (ref 135–145)
Total Bilirubin: 0.6 mg/dL (ref 0.3–1.2)
Total Protein: 5.3 g/dL — ABNORMAL LOW (ref 6.5–8.1)

## 2016-11-04 LAB — CBC
HCT: 39.9 % (ref 39.0–52.0)
Hemoglobin: 13.6 g/dL (ref 13.0–17.0)
MCH: 29.4 pg (ref 26.0–34.0)
MCHC: 34.1 g/dL (ref 30.0–36.0)
MCV: 86.2 fL (ref 78.0–100.0)
Platelets: 156 10*3/uL (ref 150–400)
RBC: 4.63 MIL/uL (ref 4.22–5.81)
RDW: 11.8 % (ref 11.5–15.5)
WBC: 5.9 10*3/uL (ref 4.0–10.5)

## 2016-11-04 LAB — T4, FREE: Free T4: 0.95 ng/dL (ref 0.61–1.12)

## 2016-11-04 MED ORDER — MAGNESIUM HYDROXIDE 400 MG/5ML PO SUSP: 30.0000 mL | Freq: Every day | ORAL | Status: DC | PRN

## 2016-11-04 MED ORDER — ALUM & MAG HYDROXIDE-SIMETH 200-200-20 MG/5ML PO SUSP: 30.0000 mL | ORAL | Status: DC | PRN

## 2016-11-04 MED ORDER — HYDROXYZINE HCL 25 MG PO TABS
25.0000 mg | ORAL_TABLET | Freq: Three times a day (TID) | ORAL | Status: DC | PRN
  Administered 2016-11-04 – 2016-11-08 (×6): 25 mg via ORAL
  Filled 2016-11-04 (×3): qty 1
  Filled 2016-11-04: qty 20
  Filled 2016-11-04 (×3): qty 1

## 2016-11-04 MED ORDER — TRAZODONE HCL 50 MG PO TABS
50.0000 mg | ORAL_TABLET | Freq: Every evening | ORAL | Status: DC | PRN
  Administered 2016-11-04 – 2016-11-08 (×5): 50 mg via ORAL
  Filled 2016-11-04: qty 1
  Filled 2016-11-04: qty 14
  Filled 2016-11-04 (×4): qty 1

## 2016-11-04 NOTE — Clinical Social Work Note (Signed)
Clinical Social Work Assessment  Patient Details  Name: George Quinn MRN: [id number] Date of Birth: Sep 27, 1980  Date of referral:  11/04/16               Reason for consult:  Suicide Risk/Attempt, Discharge Planning                Permission sought to share information with:  Other Permission granted to share information::  No  Name::        Agency::     Relationship::     Contact Information:     Housing/Transportation Living arrangements for the past 2 months:  Single Family Home Source of Information:  Patient Patient Interpreter Needed:  None Criminal Activity/Legal Involvement Pertinent to Current Situation/Hospitalization:  No - Comment as needed Significant Relationships:  Other Family Members, Parents Lives with:  Parents Do you feel safe going back to the place where you live?  Yes Need for family participation in patient care:  Yes (Comment)  Care giving concerns:  No care giving concerns identified.    Social Worker assessment / plan:  CSW consulted for "Intentional Overdose; Patient states he wants help for depression, suicide ideations & substance abuse; needs resources, patient states he has no insurance or has no PCP;Current substance abuse;  Inpatient Psych admission; Intentional Overdose." CSW met with pt at bedside with mother present. Pt presents very calm and accepting. Pt agreeable to [facility] and signed voluntary consent for admission. Consent faxed to [phone]. CSW provided brief counseling.  Pt ready for discharge today. CSW confirmed bed availability at [facility] with Otila Kluver (AD). CSW arranged transportation with [facility] for 4pm. RN to call report to the Adult Unit-[phone].CSW signing off as no other Social Work needs identified.   Employment status:  [employer] information:  Other (Comment Required) (Self pay (Medicaid potential)) PT Recommendations:  Not assessed at this time Information / Referral to community resources:  Inpatient Psychiatric Care  (Comment Required)  Patient/Family's Response to care:  Pt and family appreciative of CSW support and guidance.   Patient/Family's Understanding of and Emotional Response to Diagnosis, Current Treatment, and Prognosis:  Pt very aware of his need for intervention and is voluntarily discharging to [facility] for treatment.   Emotional Assessment Appearance:  Appears stated age Attitude/Demeanor/Rapport:  Other (Appropriate) Affect (typically observed):  Calm, Accepting Orientation:  Oriented to Self, Oriented to Place, Oriented to  Time, Oriented to Situation Alcohol / Substance use:  Illicit Drugs Psych involvement (Current and /or in the community):  Yes (Comment) (Psych rec-inpt pysch unit)  Discharge Needs  Concerns to be addressed:  Discharge Planning Concerns, Substance Abuse Concerns, Mental Health Concerns Readmission within the last 30 days:  No Current discharge risk:  Other (intentional overdose upon admission) Barriers to Discharge:  Continued Medical Work up   [employer], LCSW 11/04/2016, 4:28 PM

## 2016-11-04 NOTE — Progress Notes (Signed)
[facility] PROGRESS NOTE  Laureen Abrahamsick Latini ZOX:[id number] DOB: 22-Sep-1980 DOA: 11/01/2016  PCP: Patient, No Pcp Per  Brief History/Interval Summary: 36 year old Caucasian male with a past medical history of polysubstance abuse including tobacco, alcohol, cocaine, marijuana, benzodiazepine, presents with intentional drug overdose with Percocet. He took 20 tablets and also took an entire bottle of generic sleeping pills. Patient was found to have elevated LFTs. Tylenol level was less than 10. Patient was hospitalized for further management.  Reason for Visit: Intentional drug overdose  Consultants: Psychiatry  Procedures: None  Antibiotics: None  Subjective/Interval History: Patient feels better. Denies any complaints. No nausea, vomiting. Tolerating his diet.   ROS: Denies any chest pain or shortness of breath  Objective:  Vital Signs  Vitals:   11/03/16 1635 11/03/16 1923 11/04/16 0608 11/04/16 0612  BP: 119/74 113/67 120/77   Pulse: (!) 56 (!) 56 (!) 56   Resp: 18 16 18    Temp: 98.6 F (37 C) 98.9 F (37.2 C) 97.9 F (36.6 C)   TempSrc: Oral Oral Oral   SpO2: 97% 100% 95%   Weight:    60.6 kg (133 lb 11.2 oz)  Height:        Intake/Output Summary (Last 24 hours) at 11/04/16 0811 Last data filed at 11/03/16 2136  Gross per 24 hour  Intake              840 ml  Output             2600 ml  Net            -1760 ml   Filed Weights   11/02/16 0257 11/03/16 0531 11/04/16 0612  Weight: 59.1 kg (130 lb 4.8 oz) 61.7 kg (136 lb 1.6 oz) 60.6 kg (133 lb 11.2 oz)   Telemetry: Sinus bradycardia is noted.  General appearance: Awake, alert. In no distress Resp: Clear to auscultation bilaterally Cardio: S1, S2 is normal, regular. No rubs, murmurs or bruit GI: Abdomen is soft. Nontender, nondistended. Neurologic: No focal deficits  Lab Results:  Data Reviewed: I have personally reviewed following labs and imaging studies  CBC:  Recent Labs Lab 11/01/16 1911  11/02/16 0203 11/04/16 0430  WBC 5.8 5.2 5.9  HGB 14.3 14.2 13.6  HCT 43.5 41.3 39.9  MCV 89.1 86.6 86.2  PLT 206 169 156    Basic Metabolic Panel:  Recent Labs Lab 11/01/16 1911 11/02/16 0137 11/03/16 0302 11/04/16 0430  NA 137 139 140 140  K 3.9 3.6 3.7 3.7  CL 98* 104 107 107  CO2 31 29 26 27   GLUCOSE 91 102* 99 93  BUN 7 9 7  5*  CREATININE 0.95 0.85 0.71 0.73  CALCIUM 9.3 8.9 8.6* 8.6*    GFR: Estimated Creatinine Clearance: 111.5 mL/min (by C-G formula based on SCr of 0.73 mg/dL).  Liver Function Tests:  Recent Labs Lab 11/01/16 1911 11/02/16 0137 11/03/16 0302 11/04/16 0430  AST 348* 335* 328* 324*  ALT 305* 297* 341* 403*  ALKPHOS 63 64 61 59  BILITOT 1.3* 0.6 0.5 0.6  PROT 6.2* 5.8* 4.9* 5.3*  ALBUMIN 4.0 3.8 3.1* 3.3*     Coagulation Profile:  Recent Labs Lab 11/02/16 0137 11/03/16 0302  INR 1.08 1.17    Cardiac Enzymes:  Recent Labs Lab 11/01/16 1911  CKTOTAL 52     Radiology Studies: Koreas Abdomen Limited Ruq  Result Date: 11/02/2016 CLINICAL DATA:  Transaminitis for 1 day. EXAM: ULTRASOUND ABDOMEN LIMITED RIGHT UPPER QUADRANT COMPARISON:  None. FINDINGS:  Gallbladder: No gallstones visualized. There is mild pericholecystic fluid. No sonographic Murphy sign noted by sonographer. Common bile duct: Diameter: 2.3 mm Liver: No focal lesion identified. Within normal limits in parenchymal echogenicity. IMPRESSION: Mild pericholecystic fluid. There is no sonographic Murphy sign or gallstones. Liver is normal. Electronically Signed   By: Sherian ReinWei-Chen  Lin M.D.   On: 11/02/2016 19:33     Medications:  Scheduled: . enoxaparin (LOVENOX) injection  40 mg Subcutaneous Q24H  . folic acid  1 mg Oral Daily  . multivitamin with minerals  1 tablet Oral Daily  . nicotine  21 mg Transdermal Daily  . thiamine  100 mg Oral Daily   Continuous: . sodium chloride 50 mL/hr (11/03/16 1631)   ZOX:[id number]:LORazepam **OR** LORazepam, ondansetron,  zolpidem  Assessment/Plan:  Principal Problem:   Overdose by acetaminophen Active Problems:   Suicidal behavior   Abnormal LFTs   Tobacco abuse   Polysubstance abuse   Alcohol abuse   Bradycardia   Drug overdose    Intentional drug overdose. Patient took about 20 tablets of Percocet. Tylenol level has been less than 10, but his LFTs have been abnormal. Poison control recommended n-acetylcysteine. This was given for about 48 hours. LFTs remain abnormal, however, this could be due to his history of hepatitis C. INR is normal.  Suicidal behavior. Corporate investment bankerContinue sitter. Seen by psychiatry. Plan is for inpatient psychiatric admission.  Transaminitis/h/o Hep c LFTs are elevated but stable. No old lab results are available. HCV antibody was greater than 11. Quantitative testing is pending. No concerning changes noted on ultrasound. He will need to be seen at the hepatitis C clinic.   History of polysubstance abuse. This includes tobacco, alcohol, cocaine, marijuana and benzodiazepine. Stable. No evidence for withdrawal. Continue CIWA protocol. Continue multivitamins and thiamine. HIV nonreactive. Patient mentions that he is on Suboxone. No mention of this medication under his medication list. Pharmacy to verify.  Bradycardia EKG shows sinus bradycardia. TSH low at 0.344. Free T4 level is normal. Patient mentions that he always has low heart rate. He is asymptomatic. No further evaluation necessary. Can be taken off of telemetry.    DVT Prophylaxis: Lovenox    Code Status: Full code  Family Communication: Discussed with the patient  Disposition Plan: Management as outlined above. Mobilize. Cleared for transfer to behavioral health.    LOS: 2 days   [facility] - Le[facility]ownKRISHNAN,GOKUL  [facility] Pager [phone] 11/04/2016, 8:11 AM  If 7PM-7AM, please contact night-coverage at [email or web address], password [email or web address]

## 2016-11-04 NOTE — Progress Notes (Signed)
Patient did not attend the evening speaker AA meeting. Pt was notified that group was beginning but returned to his room.   

## 2016-11-04 NOTE — Progress Notes (Signed)
Patient states that he takes Suboxone and feels like he is withdrawing from this since he has not had it here.  RN administered Ativan IV for withdrawal as ordered.  I asked patient where he gets his Suboxone from as it is not on his prior to admit medications and patient says he buys it off the street.  Will continue to monitor.

## 2016-11-04 NOTE — Discharge Summary (Signed)
[facility]  Physician Discharge Summary   Patient ID: George Quinn MRN: [id number] DOB/AGE: [birth year] 36 y.o.  Admit date: 11/01/2016 Discharge date: 11/04/2016  PCP: Patient, No Pcp Per  DISCHARGE DIAGNOSES:  Principal Problem:   Overdose by acetaminophen Active Problems:   Suicidal behavior   Abnormal LFTs   Tobacco abuse   Polysubstance abuse   Alcohol abuse   Bradycardia   Drug overdose   RECOMMENDATIONS FOR OUTPATIENT FOLLOW UP: 1. Patient will benefit from being referred to the hepatitis C clinic with Infectious Diseases when he is ready to be discharged from behavioral health 2. Hepatitis C quantitative test results are pending   DISCHARGE CONDITION: good  Diet recommendation: Regular  Filed Weights   11/02/16 0257 11/03/16 0531 11/04/16 0612  Weight: 59.1 kg (130 lb 4.8 oz) 61.7 kg (136 lb 1.6 oz) 60.6 kg (133 lb 11.2 oz)    INITIAL HISTORY: 36 year old Caucasian male with a past medical history of polysubstance abuse including tobacco, alcohol, cocaine, marijuana, benzodiazepine, presents with intentional drug overdose with Percocet. He took 20 tablets and also took an entire bottle of generic sleeping pills. Patient was found to have elevated LFTs. Tylenol level was less than 10. Patient was hospitalized for further management.  Consultations:  Psychiatry  Procedures:  None  HOSPITAL COURSE:    Intentional drug overdose. Patient took about 20 tablets of Percocet. Tylenol level has been less than 10, but his LFTs have been abnormal. Poison control recommended n-acetylcysteine. This was given for about 48 hours. LFTs remain abnormal, however, this could be due to his history of hepatitis C. INR is normal.  Suicidal behavior. Seen by psychiatry who recommended inpatient psychiatric care.  Transaminitis/History of Hepatitis C  LFTs are elevated but stable. No old lab results are available. HCV antibody was greater than 11. Quantitative  testing was ordered and is pending. No concerning changes noted on ultrasound. He will need to be seen at the hepatitis C clinic at the infectious disease center.   History of polysubstance abuse. This includes tobacco, alcohol, cocaine, marijuana and benzodiazepine. No evidence for withdrawal. He was placed on CIWA protocol. And he was given multivitamins and thiamine. HIV nonreactive. Patient mentions that he is on Suboxone. No mention of this medication under his medication list. Apparently he buys this off the street.  Bradycardia EKG shows sinus bradycardia. TSH low at 0.344. Free T4 level is normal. Patient mentions that he always has low heart rate. He is asymptomatic. No further evaluation necessary.   Overall, stable. Medically cleared for discharge/transfer to behavioral health.   PERTINENT LABS:  The results of significant diagnostics from this hospitalization (including imaging, microbiology, ancillary and laboratory) are listed below for reference.      Labs: Basic Metabolic Panel:  Recent Labs Lab 11/01/16 1911 11/02/16 0137 11/03/16 0302 11/04/16 0430  NA 137 139 140 140  K 3.9 3.6 3.7 3.7  CL 98* 104 107 107  CO2 31 29 26 27   GLUCOSE 91 102* 99 93  BUN 7 9 7  5*  CREATININE 0.95 0.85 0.71 0.73  CALCIUM 9.3 8.9 8.6* 8.6*   Liver Function Tests:  Recent Labs Lab 11/01/16 1911 11/02/16 0137 11/03/16 0302 11/04/16 0430  AST 348* 335* 328* 324*  ALT 305* 297* 341* 403*  ALKPHOS 63 64 61 59  BILITOT 1.3* 0.6 0.5 0.6  PROT 6.2* 5.8* 4.9* 5.3*  ALBUMIN 4.0 3.8 3.1* 3.3*   CBC:  Recent Labs Lab 11/01/16 1911 11/02/16 0203 11/04/16 0430  WBC 5.8 5.2 5.9  HGB 14.3 14.2 13.6  HCT 43.5 41.3 39.9  MCV 89.1 86.6 86.2  PLT 206 169 156   Cardiac Enzymes:  Recent Labs Lab 11/01/16 1911  CKTOTAL 52    IMAGING STUDIES Koreas Abdomen Limited Ruq  Result Date: 11/02/2016 CLINICAL DATA:  Transaminitis for 1 day. EXAM: ULTRASOUND ABDOMEN LIMITED RIGHT  UPPER QUADRANT COMPARISON:  None. FINDINGS: Gallbladder: No gallstones visualized. There is mild pericholecystic fluid. No sonographic Murphy sign noted by sonographer. Common bile duct: Diameter: 2.3 mm Liver: No focal lesion identified. Within normal limits in parenchymal echogenicity. IMPRESSION: Mild pericholecystic fluid. There is no sonographic Murphy sign or gallstones. Liver is normal. Electronically Signed   By: Sherian ReinWei-Chen  Lin M.D.   On: 11/02/2016 19:33    DISCHARGE EXAMINATION: See progress note from earlier today  DISPOSITION: Behavioral health    TOTAL DISCHARGE TIME: 35 mins  [facility] HospitalKRISHNAN,GOKUL  [facility] Pager [phone]  11/04/2016, 4:36 PM

## 2016-11-04 NOTE — Tx Team (Signed)
Initial Treatment Plan 11/04/2016 5:32 PM Laureen Abrahamsick Keetch [id number]    PATIENT STRESSORS: Financial difficulties Marital or family conflict Substance abuse   PATIENT STRENGTHS: Communication skills General fund of knowledge Physical Health   PATIENT IDENTIFIED PROBLEMS: Depression  Anxiety  Substance abuse  Suicidal ideation/attempt  "Get help with my depression"  Get help with my anxiety"  "Get referred to a suboxone clinic"         DISCHARGE CRITERIA:  Improved stabilization in mood, thinking, and/or behavior Verbal commitment to aftercare and medication compliance Withdrawal symptoms are absent or subacute and managed without 24-hour nursing intervention  PRELIMINARY DISCHARGE PLAN: Outpatient therapy Medication management  PATIENT/FAMILY INVOLVEMENT: This treatment plan has been presented to and reviewed with the patient, Laureen Abrahamsick Keir.  The patient and family have been given the opportunity to ask questions and make suggestions.  Levin BaconHeather V Reddick, RN 11/04/2016, 5:32 PM

## 2016-11-04 NOTE — Progress Notes (Signed)
Weston Brassick is a 36 year old being admitted voluntarily to [facility] from [facility].  He was admitted medically after intentional drug overdose (20 Percocets) and drank a fifth of vodka as a suicide attempt. He has history of polysubstance abuse.  He has no chronic medical issues. He currently denies suicidal ideation.  He is c/o of withdrawal symptoms from suboxone (which he was buying on the street) and alcohol withdrawal.  C/O body aches, nausea and runny nose.  Oriented him to the unit.  Admission paperwork completed and signed.  Belongings searched and no belongings needing locked up on the unit.  Skin assessment completed and noted bug bites(healing) on both legs.  Q 15 minute checks initiated for safety.  We will monitor the progress towards his goals.

## 2016-11-05 DIAGNOSIS — F1920 Other psychoactive substance dependence, uncomplicated: Secondary | ICD-10-CM

## 2016-11-05 DIAGNOSIS — T50902D Poisoning by unspecified drugs, medicaments and biological substances, intentional self-harm, subsequent encounter: Secondary | ICD-10-CM

## 2016-11-05 MED ORDER — FLUOXETINE HCL 20 MG PO CAPS
20.0000 mg | ORAL_CAPSULE | Freq: Every day | ORAL | Status: DC
  Administered 2016-11-05 – 2016-11-08 (×4): 20 mg via ORAL
  Filled 2016-11-05 (×2): qty 14
  Filled 2016-11-05 (×5): qty 1

## 2016-11-05 NOTE — [facility] Suicide Risk Assessment (Signed)
[facility]   Nursing information obtained from:  Patient Demographic factors:  Male Current Mental Status:  Self-harm behaviors Loss Factors:  Financial problems / change in socioeconomic status Historical Factors:  Prior suicide attempts Risk Reduction Factors:  NA  Total Time spent with patient: 45 minutes Principal Problem: <principal problem not specified> Diagnosis:   Patient Active Problem List   Diagnosis Date Noted  . MDD (major depressive disorder) [F32.9] 11/04/2016  . Overdose by acetaminophen [T39.1X1A] 11/02/2016  . Suicidal behavior [R46.89] 11/02/2016  . Abnormal LFTs [R94.5] 11/02/2016  . Alcohol abuse [F10.10] 11/02/2016  . Bradycardia [R00.1] 11/02/2016  . Drug overdose [T50.901A] 11/02/2016  . Tobacco abuse [Z72.0]   . Polysubstance abuse [F19.10]    Subjective Data: George Quinn is a 36 years old [facility] increased symptoms of depression and status post intentional drug overdose required medical admission as per poison control guidance. He also endorses polysubstance abuse. His mother is his support system. [facility]  Continued Clinical Symptoms:  Alcohol Use Disorder Identification Test Final Score (AUDIT): 31 The "Alcohol Use Disorders Identification Test", Guidelines for Use in Primary Care, Second Edition.  World Science writerHealth Organization [facility](WHO). Score between 0-7:  no or low risk or alcohol related problems. Score between 8-15:  moderate risk of alcohol related problems. Score between 16-19:  high risk of alcohol related problems. Score 20 or above:  warrants further diagnostic evaluation for alcohol dependence and treatment.   CLINICAL FACTORS:   Severe Anxiety and/or Agitation Depression:   Anhedonia Comorbid alcohol abuse/dependence Hopelessness Impulsivity Insomnia Recent sense of peace/wellbeing Severe Alcohol/Substance Abuse/Dependencies More than one psychiatric  diagnosis Previous Psychiatric Diagnoses and Treatments   Psychiatric Specialty Exam: Physical Exam  ROS  Blood pressure 123/89, pulse (!) 55, temperature 97.7 F (36.5 C), temperature source Oral, resp. rate 16, height 5\' 7"  (1.702 m), weight 61.2 kg (135 lb), SpO2 100 %.Body mass index is 21.14 kg/m.     COGNITIVE FEATURES THAT CONTRIBUTE TO RISK:  Closed-mindedness, Loss of executive function, Polarized thinking and Thought constriction (tunnel vision)    SUICIDE RISK:   Severe:  Frequent, intense, and enduring suicidal ideation, specific plan, no subjective intent, but some objective markers of intent (i.e., choice of lethal method), the method is accessible, some limited preparatory behavior, evidence of impaired self-control, severe dysphoria/symptomatology, multiple risk factors present, and few if any protective factors, particularly a lack of social support.  PLAN OF CARE: Admit for increased symptoms of depression and status post suicide attempt and [facility]. He has polysubstance abuse and history of detox and rehab treatments. Patient needs crisis stabilization, medication management and safety monitoring.   I certify that inpatient services furnished can reasonably be expected to improve the patient's condition.   Leata MouseJANARDHANA JONNALAGADDA, MD 11/05/2016, 9:44 AM

## 2016-11-05 NOTE — [facility] Group Notes (Signed)
[facility] LCSW Group Therapy  11/05/2016 10:00 AM  Type of Therapy:  Group Therapy  Participation Level:  Did Not Attend; invited to participate yet did not despite overhead announcement and encouragement by staff  Summary of Progress/Problems: Topic for today was thoughts, feelings and emotions related to what may lie behind anger. Patient's had opportunity to identify with feelings such as: Jealousy, Hurt, Anxiety, Shame, Sadness, Fear, Frustration, Guilt, Disappointment, Worry, and Embarrassment. Group processed discrepancy and difficulty in admitting to underlying feelings. P   Carney Bernatherine C Harrill, LCSW
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
## 2016-11-06 DIAGNOSIS — F419 Anxiety disorder, unspecified: Secondary | ICD-10-CM

## 2016-11-06 DIAGNOSIS — F39 Unspecified mood [affective] disorder: Secondary | ICD-10-CM

## 2016-11-06 DIAGNOSIS — G4700 Insomnia, unspecified: Secondary | ICD-10-CM

## 2016-11-06 DIAGNOSIS — F1490 Cocaine use, unspecified, uncomplicated: Secondary | ICD-10-CM

## 2016-11-06 DIAGNOSIS — F17210 Nicotine dependence, cigarettes, uncomplicated: Secondary | ICD-10-CM

## 2016-11-06 DIAGNOSIS — F1290 Cannabis use, unspecified, uncomplicated: Secondary | ICD-10-CM

## 2016-11-06 LAB — LIPID PANEL
Cholesterol: 123 mg/dL (ref 0–200)
HDL: 46 mg/dL (ref >40)
LDL Cholesterol: 62 mg/dL (ref 0–99)
Total CHOL/HDL Ratio: 2.7 RATIO
Triglycerides: 74 mg/dL (ref <150)
VLDL: 15 mg/dL (ref 0–40)

## 2016-11-06 NOTE — Progress Notes (Signed)
Recreation Therapy Notes  Date: 11/06/2016 Time: 9:30am Location: [address] Dayroom  Group Topic: Stress Management  Goal Area(s) Addresses:  Patient will verbalize importance of using healthy stress management.  Patient will identify positive emotions associated with healthy stress management.   Intervention: Stress Management  Activity :  Guided Body Scan. Recreation Therapy Intern introduced the stress management technique of guided body scanning. Recreation Therapy Intern played a YouTube video that allowed patients to mentally scan their body for areas of tensions. Patients were to follow along as video was played to engage in the activity.  Education: Stress Management, Discharge Planning.   Education Outcome: Acknowledges edcuation  Clinical Observations/Feedback: Pt did not attend group.  Rachel Meyer, Recreation Therapy Intern 
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
## 2016-11-06 NOTE — Plan of Care (Signed)
Problem: Medication: Goal: Compliance with prescribed medication regimen will improve Outcome: Progressing Patient is compliant with medication regimen.   

## 2016-11-06 NOTE — Progress Notes (Signed)
DAR NOTE: Patient presents with calm affect and pleasant mood.  Denies pain, auditory and visual hallucinations.  No withdrawal symptoms reported or observed.  Rates depression at 8, hopelessness at 7, and anxiety at 10.  Maintained on routine safety checks.  Medications given as prescribed.  Support and encouragement offered as needed.  Attended group and participated.  States goal for today is "anxiety and depression."  Patient visible in milieu.  Interaction with staff and peers observed. Offered no complaint.

## 2016-11-06 NOTE — Progress Notes (Signed)
At the beginning of the shift, pt was lying awake on his bed.  He states his day started out a little rough, but this afternoon has been fine.  He reports no withdrawal symptoms at this time.  He denies SI/HI/AVH.  He voiced no needs or concerns.  He requested Vistaril for anxiety and Trazodone for sleep at bedtime which he was given.  Pt has been pleasant and appropriate for staff this evening.  Support and encouragement offered.  Discharge plans are in process.  Safety maintained with q15 minute checks.

## 2016-11-06 NOTE — Tx Team (Signed)
Interdisciplinary Treatment and Diagnostic Plan Update  11/06/2016 Time of Session: 0830AM George Quinn MRN: [id number]  Principal Diagnosis: MDD recurrent, severe  Secondary Diagnoses: Active Problems:   MDD (major depressive disorder)   Current Medications:  Current Facility-Administered Medications  Medication Dose Route Frequency Provider Last Rate Last Dose  . alum & mag hydroxide-simeth (MAALOX/MYLANTA) 200-200-20 MG/5ML suspension 30 mL  30 mL Oral Q4H PRN Okonkwo, Justina A, NP      . FLUoxetine (PROZAC) capsule 20 mg  20 mg Oral Daily Ambrose Finland, MD   20 mg at 11/06/16 0825  . hydrOXYzine (ATARAX/VISTARIL) tablet 25 mg  25 mg Oral TID PRN Hughie Closs A, NP   25 mg at 11/05/16 2146  . magnesium hydroxide (MILK OF MAGNESIA) suspension 30 mL  30 mL Oral Daily PRN Okonkwo, Justina A, NP      . traZODone (DESYREL) tablet 50 mg  50 mg Oral QHS PRN Okonkwo, Justina A, NP   50 mg at 11/05/16 2146   PTA Medications: No prescriptions prior to admission.    Patient Stressors: Financial difficulties Marital or family conflict Substance abuse  Patient Strengths: Curator fund of knowledge Physical Health  Treatment Modalities: Medication Management, Group therapy, Case management,  1 to 1 session with clinician, Psychoeducation, Recreational therapy.   Physician Treatment Plan for Primary Diagnosis:MDD recurrent, severe Long Term Goal(s): Improvement in symptoms so as ready for discharge Improvement in symptoms so as ready for discharge   Short Term Goals: Ability to identify changes in lifestyle to reduce recurrence of condition will improve Ability to verbalize feelings will improve Ability to disclose and discuss suicidal ideas Ability to demonstrate self-control will improve Ability to identify and develop effective coping behaviors will improve Ability to maintain clinical measurements within normal limits will improve Compliance  with prescribed medications will improve Ability to identify triggers associated with substance abuse/mental health issues will improve  Medication Management: Evaluate patient's response, side effects, and tolerance of medication regimen.  Therapeutic Interventions: 1 to 1 sessions, Unit Group sessions and Medication administration.  Evaluation of Outcomes: Not Met  Physician Treatment Plan for Secondary Diagnosis: Active Problems:   MDD (major depressive disorder)  Long Term Goal(s): Improvement in symptoms so as ready for discharge Improvement in symptoms so as ready for discharge   Short Term Goals: Ability to identify changes in lifestyle to reduce recurrence of condition will improve Ability to verbalize feelings will improve Ability to disclose and discuss suicidal ideas Ability to demonstrate self-control will improve Ability to identify and develop effective coping behaviors will improve Ability to maintain clinical measurements within normal limits will improve Compliance with prescribed medications will improve Ability to identify triggers associated with substance abuse/mental health issues will improve     Medication Management: Evaluate patient's response, side effects, and tolerance of medication regimen.  Therapeutic Interventions: 1 to 1 sessions, Unit Group sessions and Medication administration.  Evaluation of Outcomes: Not Met   RN Treatment Plan for Primary Diagnosis: MDD recurrent, severe Long Term Goal(s): Knowledge of disease and therapeutic regimen to maintain health will improve  Short Term Goals: Ability to remain free from injury will improve, Ability to disclose and discuss suicidal ideas and Ability to identify and develop effective coping behaviors will improve  Medication Management: RN will administer medications as ordered by provider, will assess and evaluate patient's response and provide education to patient for prescribed medication. RN will  report any adverse and/or side effects to prescribing provider.  Therapeutic Interventions:  1 on 1 counseling sessions, Psychoeducation, Medication administration, Evaluate responses to treatment, Monitor vital signs and CBGs as ordered, Perform/monitor CIWA, COWS, AIMS and Fall Risk screenings as ordered, Perform wound care treatments as ordered.  Evaluation of Outcomes: Not Met   LCSW Treatment Plan for Primary Diagnosis: MDD recurrent, severe Long Term Goal(s): Safe transition to appropriate next level of care at discharge, Engage patient in therapeutic group addressing interpersonal concerns.  Short Term Goals: Engage patient in aftercare planning with referrals and resources, Facilitate acceptance of mental health diagnosis and concerns, Facilitate patient progression through stages of change regarding substance use diagnoses and concerns and Identify triggers associated with mental health/substance abuse issues  Therapeutic Interventions: Assess for all discharge needs, 1 to 1 time with Social worker, Explore available resources and support systems, Assess for adequacy in community support network, Educate family and significant other(s) on suicide prevention, Complete Psychosocial Assessment, Interpersonal group therapy.  Evaluation of Outcomes: Not Met   Progress in Treatment: Attending groups: No. New to unit. Continuing to assess.  Participating in groups: No. Taking medication as prescribed: Yes. Toleration medication: Yes. Family/Significant other contact made: No, will contact:  pt's mother for SPE/collateral information Patient understands diagnosis: Yes. Discussing patient identified problems/goals with staff: Yes. Medical problems stabilized or resolved: Yes. Denies suicidal/homicidal ideation: Yes. Issues/concerns per patient self-inventory: No. Other: n/a  New problem(s) identified: No, Describe:  n/a  New Short Term/Long Term Goal(s): detox/medication management for  mood stabilization; development of comprehensive mental wellness/sobriety plan.   Discharge Plan or Barriers: CSW assessing for appropriate referrals. Pt has no current providers. He [place] in [place]/no insurance.   Reason for Continuation of Hospitalization: Anxiety Depression Medication stabilization Suicidal ideation Withdrawal symptoms  Estimated Length of Stay:  Thursday 11/09/16  Attendees: Patient: 11/06/2016 10:26 AM  Physician: Dr. Parke Poisson; Dr. Dwyane Dee MD 11/06/2016 10:26 AM  Nursing: Towanda Malkin RN 11/06/2016 10:26 AM  RN Care Manager: Lars Pinks CM 11/06/2016 10:26 AM  Social Worker: Maxie Better, LCSW 11/06/2016 10:26 AM  Recreational Therapist: x 11/06/2016 10:26 AM  Other: Marvia Pickles NP; Mordecai Maes NP; Ricky Ala NP 11/06/2016 10:26 AM  Other:  11/06/2016 10:26 AM  Other: 11/06/2016 10:26 AM    Scribe for Treatment Team: [place], LCSW 11/06/2016 10:26 AM

## 2016-11-07 ENCOUNTER — Encounter (HOSPITAL_COMMUNITY): Payer: Self-pay | Admitting: Behavioral Health

## 2016-11-07 DIAGNOSIS — F1010 Alcohol abuse, uncomplicated: Secondary | ICD-10-CM

## 2016-11-07 DIAGNOSIS — Z720 Tobacco use: Secondary | ICD-10-CM

## 2016-11-07 DIAGNOSIS — T391X2A Poisoning by 4-Aminophenol derivatives, intentional self-harm, initial encounter: Secondary | ICD-10-CM

## 2016-11-07 DIAGNOSIS — R001 Bradycardia, unspecified: Secondary | ICD-10-CM

## 2016-11-07 DIAGNOSIS — T4272XA Poisoning by unspecified antiepileptic and sedative-hypnotic drugs, intentional self-harm, initial encounter: Secondary | ICD-10-CM

## 2016-11-07 DIAGNOSIS — F1910 Other psychoactive substance abuse, uncomplicated: Secondary | ICD-10-CM

## 2016-11-07 DIAGNOSIS — R4689 Other symptoms and signs involving appearance and behavior: Secondary | ICD-10-CM

## 2016-11-07 DIAGNOSIS — R945 Abnormal results of liver function studies: Secondary | ICD-10-CM

## 2016-11-07 DIAGNOSIS — T1491XA Suicide attempt, initial encounter: Secondary | ICD-10-CM

## 2016-11-07 DIAGNOSIS — F322 Major depressive disorder, single episode, severe without psychotic features: Secondary | ICD-10-CM

## 2016-11-07 LAB — HEPATITIS C GENOTYPE: Hepatitis C Genotype: 3

## 2016-11-07 LAB — HEMOGLOBIN A1C
Hgb A1c MFr Bld: 5.3 % (ref 4.8–5.6)
Mean Plasma Glucose: 105 mg/dL

## 2016-11-07 LAB — HCV RNA QUANT RFLX ULTRA OR GENOTYP
HCV RNA Qnt(log copy/mL): 6.74 log10 IU/mL
HepC Qn: 5490000 IU/mL

## 2016-11-07 LAB — PROLACTIN: Prolactin: 19.1 ng/mL — ABNORMAL HIGH (ref 4.0–15.2)

## 2016-11-07 MED ORDER — FLUOXETINE HCL 20 MG PO CAPS: 20.0000 mg | ORAL_CAPSULE | Freq: Every day | ORAL | 0 refills | Status: AC

## 2016-11-07 MED ORDER — BENZOCAINE 10 % MT GEL
Freq: Three times a day (TID) | OROMUCOSAL | Status: DC | PRN
  Administered 2016-11-07: 22:00:00 via OROMUCOSAL
  Filled 2016-11-07: qty 9.4

## 2016-11-07 MED ORDER — HYDROXYZINE HCL 25 MG PO TABS: 25.0000 mg | ORAL_TABLET | Freq: Three times a day (TID) | ORAL | 0 refills | Status: AC | PRN

## 2016-11-07 MED ORDER — IBUPROFEN 600 MG PO TABS
600.0000 mg | ORAL_TABLET | Freq: Four times a day (QID) | ORAL | Status: DC | PRN
Start: 2016-11-07 — End: 2016-11-09
  Administered 2016-11-08: 600 mg via ORAL
  Filled 2016-11-07: qty 1

## 2016-11-07 MED ORDER — TRAZODONE HCL 50 MG PO TABS: 50.0000 mg | ORAL_TABLET | Freq: Every evening | ORAL | 0 refills | Status: AC | PRN

## 2016-11-07 NOTE — Progress Notes (Signed)
Pt has been in the dayroom all evening sitting in the corner watching TV.  He reports he has had a good day and voices no needs or concerns.  He denies SI/HI/AVH.  He denies having any withdrawal symptoms.  He hopes to discharge soon.  Support and encouragement offered.  Discharge plans are in process.  Safety maintained with q15 minute checks.

## 2016-11-07 NOTE — Discharge Summary (Signed)
Physician Discharge Summary Note  Patient:  George Quinn is an 36 y.o., male MRN:  [id number] DOB:  Jan 05, 1981 Patient phone:  [phone] (home)  Patient address:   [address] [place] [address],  Total Time spent with patient: 30 minutes  Date of Admission:  11/04/2016 Date of Discharge: 11/08/2016  Reason for Admission: Depression, status post SA, polysubstance abuse.   Principal Problem: MDD (major depressive disorder) Discharge Diagnoses: Patient Active Problem List   Diagnosis Date Noted  . MDD (major depressive disorder) [F32.9] 11/04/2016  . Overdose by acetaminophen [T39.1X1A] 11/02/2016  . Suicidal behavior [R46.89] 11/02/2016  . Abnormal LFTs [R94.5] 11/02/2016  . Alcohol abuse [F10.10] 11/02/2016  . Bradycardia [R00.1] 11/02/2016  . Drug overdose [T50.901A] 11/02/2016  . Tobacco abuse [Z72.0]   . Polysubstance abuse [F19.10]     Past Psychiatric History: patient denied previous history of acute psychiatric hospitalization or outpatient psychiatric medication management. Patient has no previous substance abuse rehabilitation treatment.  Past Medical History:  Past Medical History:  Diagnosis Date  . Alcohol abuse   . HCV (hepatitis C virus)   . Hepatitis C   . Polysubstance abuse   . Tobacco abuse    History reviewed. No pertinent surgical history. Family History:  Family History  Problem Relation Age of Onset  . Diabetes Mellitus II Mother   . Thyroid disease Mother    Family Psychiatric  History: Family history significant for substance abuse especially in his cousins Social History:  History  Alcohol Use  . Yes     History  Drug Use  . Types: Cocaine, Marijuana    Social History   Social History  . Marital status: Single    Spouse name: N/A  . Number of children: N/A  . Years of education: N/A   Social History Main Topics  . Smoking status: Current Every Day Smoker  . Smokeless tobacco: Never Used  . Alcohol use Yes   . Drug use: Yes    Types: Cocaine, Marijuana  . Sexual activity: Not Asked   Other Topics Concern  . None   Social History Narrative  . None    Hospital Course:  George Quinn is a 36 years old male, [facility] for increased symptoms of depression and status post suicide attempt with intentional drug overdose. Patient endorses polysubstance abuse. Patient reportedly overdosed on Percocets20 and one bottle of sleeping pills (over the counter medication) and drank1/5 of vodka with intention to end his life. Patient found himself in a ditch near his house after 2 days of intentional overdose and then woke up from ditch went his mom's home and told her what he did. Patient mother helped him to contact the suicide Hotline. Patient has elevated liver enzymes during medical treatment. Patient received appropriate medication regimen as recommended by poison control.   After the above admission assessment and during this hospital course, patients presenting symptoms were identified. Patient endorsed a substance abuse history of crack cocaine and THC. His UDS was positive for cocaine, THC, and benzodiazepines. Detoxification protocol administered as necessary. Patient was treated and discharged with the following medications;Prozac 20 mg PO Daily for mood stability, Vistaril 25 mg po TID PRN for anxiety,Trazodone 50 mg  PRN for insomnia.Patient tolerated his treatment regimen without any adverse effects reported.Patient was very engaging during his hospital course. AA/NA meetings were offered & held on the unit. Prior to discharge, While on the unit, patient was able to verbalize learned  coping skills for better management of depression and suicidal thoughts During the course of his hospitalization, improvement was monitored by observation and George Quinn's daily report of symptom reduction, presentation of good affect,and overall improvement in mood & behavior.    George Quinn's  case  was presented during treatment team meeting this morning. The team members were all in agreement that George Quinn was both mentally & medically stable to be discharged to continue mental health care on an outpatient basis as noted below. He was provided with all the necessary information needed to make this appointment without problems.  Upon discharge, George Quinn denied any SI/HI, AVH, delusional thoughts, or paranoia. He denied  any substance withdrawal symptoms. He was provided with prescriptions of his [facility] discharge medications to be taken to his pharmacy as well as 7 days of samples. He left [facility] with all personal belongings in no apparent distress. Transportation per patients arrangement.  Physical Findings: AIMS: Facial and Oral Movements Muscles of Facial Expression: None, normal Lips and Perioral Area: None, normal Jaw: None, normal Tongue: None, normal,Extremity Movements Upper (arms, wrists, hands, fingers): None, normal Lower (legs, knees, ankles, toes): None, normal, Trunk Movements Neck, shoulders, hips: None, normal, Overall Severity Severity of abnormal movements (highest score from questions above): None, normal Incapacitation due to abnormal movements: None, normal Patient's awareness of abnormal movements (rate only patient's report): No Awareness, Dental Status Current problems with teeth and/or dentures?: No Does patient usually wear dentures?: No  CIWA:    COWS:     Musculoskeletal: Strength & Muscle Tone: within normal limits Gait & Station: normal Patient leans: N/A  Psychiatric Specialty Exam:SEE SRA BY MD  Physical Exam  Nursing note and vitals reviewed. Constitutional: He is oriented to person, place, and time.  Neurological: He is alert and oriented to person, place, and time.    Review of Systems  Psychiatric/Behavioral: Positive for substance abuse (Hx of substance abuse ). Negative for hallucinations, memory loss and suicidal ideas. Depression: improved   Nervous/anxious: improved. Insomnia: improved.   All other systems reviewed and are negative.   Blood pressure 122/87, pulse 62, temperature 97.8 F (36.6 C), temperature source Oral, resp. rate 16, height 5\' 7"  (1.702 m), weight 135 lb (61.2 kg), SpO2 100 %.Body mass index is 21.14 kg/m.  Have you used any form of tobacco in the last 30 days? (Cigarettes, Smokeless Tobacco, Cigars, and/or Pipes): Yes  Has this patient used any form of tobacco in the last 30 days? (Cigarettes, Smokeless Tobacco, Cigars, and/or Pipes)  N/A  Blood Alcohol level:  Lab Results  Component Value Date   ETH <5 11/01/2016    Metabolic Disorder Labs:  Lab Results  Component Value Date   HGBA1C 5.3 11/06/2016   MPG 105 11/06/2016   Lab Results  Component Value Date   PROLACTIN 19.1 (H) 11/06/2016   Lab Results  Component Value Date   CHOL 123 11/06/2016   TRIG 74 11/06/2016   HDL 46 11/06/2016   CHOLHDL 2.7 11/06/2016   VLDL 15 11/06/2016   LDLCALC 62 11/06/2016    See Psychiatric Specialty Exam and Suicide Risk Assessment completed by Attending Physician prior to discharge.  Discharge destination:  [facility]  Is patient on multiple antipsychotic therapies at discharge:  No   Has Patient had three or more failed trials of antipsychotic monotherapy by history:  No  Recommended Plan for Multiple Antipsychotic Therapies: NA   Allergies as of 11/09/2016   No Known Allergies     Medication List  TAKE these medications     Indication  FLUoxetine 20 MG capsule Commonly known as:  PROZAC Take 1 capsule (20 mg total) by mouth daily.  Indication:  Major Depressive Disorder   hydrOXYzine 25 MG tablet Commonly known as:  ATARAX/VISTARIL Take 1 tablet (25 mg total) by mouth 3 (three) times daily as needed for anxiety.  Indication:  Feeling Anxious   traZODone 50 MG tablet Commonly known as:  DESYREL Take 1 tablet (50 mg total) by mouth at bedtime as needed for sleep.  Indication:   Trouble Sleeping      Follow-up Information    [facility] up.   Specialty:  Behavioral Health Why:  Walk in within 7 days of hospital/rehab discharge to be assessed for outpatient mental health services including: medication management; counseling, and support groups. Thank you.  Contact information: [address] [place] [address] [phone]        Services, [facility] Follow up on 11/09/2016.   Why:  Screening for possible admission on Thursday at 7:45AM. Please bring: photo [place], medications/prescriptions provided by the hospital. Thank you.  Contact information: [place] [place] [address] [phone]           Follow-up recommendations:  Follow up with your outpatient provided for any medical issues. Activity & diet as recommended by your primary care provider.  Comments:  Patient is instructed prior to discharge to: Take all medications as prescribed by his/her mental healthcare provider. Report any adverse effects and or reactions from the medicines to his/her outpatient provider promptly. Patient has been instructed & cautioned: To not engage in alcohol and or illegal drug use while on prescription medicines. In the event of worsening symptoms, patient is instructed to call the crisis hotline, 911 and or go to the nearest ED for appropriate evaluation and treatment of symptoms. To follow-up with his/her primary care provider for your other medical issues, concerns and or health care needs.  Signed: Denzil MagnusonLaShunda Thomas, NP 11/09/2016, 8:28 AM

## 2016-11-07 NOTE — Progress Notes (Signed)
DAR NOTE: Patient presents with calm affect and pleasant mood.  Denies pain, auditory and visual hallucinations.  Described energy level as normal and concentration as good.  Rates depression at 7, hopelessness at 7, and anxiety at 9.  Maintained on routine safety checks.  Medications given as prescribed.  Support and encouragement offered as needed.  Attended group and participated.  States goal for today is "going home."  Patient visible in the dayroom for therapy and activity.

## 2016-11-07 NOTE — Tx Team (Signed)
Interdisciplinary Treatment and Diagnostic Plan Update  11/07/2016 Time of Session: 0830AM George Quinn MRN: [id number]  Principal Diagnosis: MDD recurrent, severe  Secondary Diagnoses: Principal Problem:   MDD (major depressive disorder) Active Problems:   Polysubstance abuse   Current Medications:  Current Facility-Administered Medications  Medication Dose Route Frequency Provider Last Rate Last Dose  . alum & mag hydroxide-simeth (MAALOX/MYLANTA) 200-200-20 MG/5ML suspension 30 mL  30 mL Oral Q4H PRN Okonkwo, Justina A, NP      . FLUoxetine (PROZAC) capsule 20 mg  20 mg Oral Daily Ambrose Finland, MD   20 mg at 11/07/16 0813  . hydrOXYzine (ATARAX/VISTARIL) tablet 25 mg  25 mg Oral TID PRN Hughie Closs A, NP   25 mg at 11/07/16 0814  . magnesium hydroxide (MILK OF MAGNESIA) suspension 30 mL  30 mL Oral Daily PRN Okonkwo, Justina A, NP      . traZODone (DESYREL) tablet 50 mg  50 mg Oral QHS PRN Okonkwo, Justina A, NP   50 mg at 11/06/16 2202   PTA Medications: No prescriptions prior to admission.    Patient Stressors: Financial difficulties Marital or family conflict Substance abuse  Patient Strengths: Curator fund of knowledge Physical Health  Treatment Modalities: Medication Management, Group therapy, Case management,  1 to 1 session with clinician, Psychoeducation, Recreational therapy.   Physician Treatment Plan for Primary Diagnosis:MDD recurrent, severe Long Term Goal(s): Improvement in symptoms so as ready for discharge Improvement in symptoms so as ready for discharge   Short Term Goals: Ability to identify changes in lifestyle to reduce recurrence of condition will improve Ability to verbalize feelings will improve Ability to disclose and discuss suicidal ideas Ability to demonstrate self-control will improve Ability to identify and develop effective coping behaviors will improve Ability to maintain clinical measurements  within normal limits will improve Compliance with prescribed medications will improve Ability to identify triggers associated with substance abuse/mental health issues will improve  Medication Management: Evaluate patient's response, side effects, and tolerance of medication regimen.  Therapeutic Interventions: 1 to 1 sessions, Unit Group sessions and Medication administration.  Evaluation of Outcomes: Met  Physician Treatment Plan for Secondary Diagnosis: Principal Problem:   MDD (major depressive disorder) Active Problems:   Polysubstance abuse  Long Term Goal(s): Improvement in symptoms so as ready for discharge Improvement in symptoms so as ready for discharge   Short Term Goals: Ability to identify changes in lifestyle to reduce recurrence of condition will improve Ability to verbalize feelings will improve Ability to disclose and discuss suicidal ideas Ability to demonstrate self-control will improve Ability to identify and develop effective coping behaviors will improve Ability to maintain clinical measurements within normal limits will improve Compliance with prescribed medications will improve Ability to identify triggers associated with substance abuse/mental health issues will improve     Medication Management: Evaluate patient's response, side effects, and tolerance of medication regimen.  Therapeutic Interventions: 1 to 1 sessions, Unit Group sessions and Medication administration.  Evaluation of Outcomes: Met   RN Treatment Plan for Primary Diagnosis: MDD recurrent, severe Long Term Goal(s): Knowledge of disease and therapeutic regimen to maintain health will improve  Short Term Goals: Ability to remain free from injury will improve, Ability to disclose and discuss suicidal ideas and Ability to identify and develop effective coping behaviors will improve  Medication Management: RN will administer medications as ordered by provider, will assess and evaluate  patient's response and provide education to patient for prescribed medication. RN will report any  adverse and/or side effects to prescribing provider.  Therapeutic Interventions: 1 on 1 counseling sessions, Psychoeducation, Medication administration, Evaluate responses to treatment, Monitor vital signs and CBGs as ordered, Perform/monitor CIWA, COWS, AIMS and Fall Risk screenings as ordered, Perform wound care treatments as ordered.  Evaluation of Outcomes: Met   LCSW Treatment Plan for Primary Diagnosis: MDD recurrent, severe Long Term Goal(s): Safe transition to appropriate next level of care at discharge, Engage patient in therapeutic group addressing interpersonal concerns.  Short Term Goals: Engage patient in aftercare planning with referrals and resources, Facilitate acceptance of mental health diagnosis and concerns, Facilitate patient progression through stages of change regarding substance use diagnoses and concerns and Identify triggers associated with mental health/substance abuse issues  Therapeutic Interventions: Assess for all discharge needs, 1 to 1 time with Social worker, Explore available resources and support systems, Assess for adequacy in community support network, Educate family and significant other(s) on suicide prevention, Complete Psychosocial Assessment, Interpersonal group therapy.  Evaluation of Outcomes: Met   Progress in Treatment: Attending groups: Yes Participating in groups: Yes Taking medication as prescribed: Yes. Toleration medication: Yes. Family/Significant other contact made: SPE completed with pt's mother. Collateral information also obtained.  Patient understands diagnosis: Yes. Discussing patient identified problems/goals with staff: Yes. Medical problems stabilized or resolved: Yes. Denies suicidal/homicidal ideation: Yes. Issues/concerns per patient self-inventory: No. Other: n/a  New problem(s) identified: No, Describe:  n/a  New Short  Term/Long Term Goal(s): detox/medication management for mood stabilization; development of comprehensive mental wellness/sobriety plan.   Discharge Plan or Barriers: pt has [facility] for [facility] admission on Wed at 7:45AM. [facility] for medication management outpatient. [place] pamphlet and mobile crisis information provided.   Reason for Continuation of Hospitalization: medication management   Estimated Length of Stay:  Wed 11/08/16  Attendees: Patient: 11/07/2016 2:10 PM  Physician: Dr. Parke Poisson; Dr. Dwyane Dee MD; Dr. Sanjuana Letters MD 11/07/2016 2:10 PM  Nursing: Danae Chen RN 11/07/2016 2:10 PM  RN Care Manager: Lars Pinks CM 11/07/2016 2:10 PM  Social Worker: Press photographer, LCSW 11/07/2016 2:10 PM  Recreational Therapist: x 11/07/2016 2:10 PM  Other: Darnelle Maffucci Money NP; Mordecai Maes NP; Ricky Ala NP 11/07/2016 2:10 PM  Other:  11/07/2016 2:10 PM  Other: 11/07/2016 2:10 PM    Scribe for Treatment Team: [place], LCSW 11/07/2016 2:10 PM

## 2016-11-07 NOTE — Progress Notes (Signed)
[facility] MD Progress Note  11/07/2016 10:40 AM George Quinn  MRN:  [id number]  Subjective: " Feeling better for the most part. My anxiety was a little high this morning but I am better now."   Objective: Face to face evaluation completed, case discussed with treatment team and chart reviewed. During this evaluation, patient is alert and oriented x4, calm and cooperative. He is very pleasant on assessment. Endorses overall improvement in mood, hopelessness and anxiety. He remains complain with unit milieu. No behavorial issues have been reported or observed. He continues to endorse his main struggle is his substance use and he has hope to continuing treatment for substance abuse once discharge. He denies withdrawal symptoms at this time and there ar none observed. Reports medications are well tolerated and without side effects. At this time, he is able to contract for safety on the unit at this time.      Principal Problem: MDD (major depressive disorder) Diagnosis:   Patient Active Problem List   Diagnosis Date Noted  . MDD (major depressive disorder) [F32.9] 11/04/2016  . Overdose by acetaminophen [T39.1X1A] 11/02/2016  . Suicidal behavior [R46.89] 11/02/2016  . Abnormal LFTs [R94.5] 11/02/2016  . Alcohol abuse [F10.10] 11/02/2016  . Bradycardia [R00.1] 11/02/2016  . Drug overdose [T50.901A] 11/02/2016  . Tobacco abuse [Z72.0]   . Polysubstance abuse [F19.10]    Total Time spent with patient: 25 minutes  Past Psychiatric History: See H&P  Past Medical History:  Past Medical History:  Diagnosis Date  . Alcohol abuse   . HCV (hepatitis C virus)   . Hepatitis C   . Polysubstance abuse   . Tobacco abuse    History reviewed. No pertinent surgical history. Family History:  Family History  Problem Relation Age of Onset  . Diabetes Mellitus II Mother   . Thyroid disease Mother    Family Psychiatric  History: See H&P Social History:  History  Alcohol Use  . Yes     History  Drug  Use  . Types: Cocaine, Marijuana    Social History   Social History  . Marital status: Single    Spouse name: N/A  . Number of children: N/A  . Years of education: N/A   Social History Main Topics  . Smoking status: Current Every Day Smoker  . Smokeless tobacco: Never Used  . Alcohol use Yes  . Drug use: Yes    Types: Cocaine, Marijuana  . Sexual activity: Not Asked   Other Topics Concern  . None   Social History Narrative  . None   Additional Social History:       Sleep: Good  Appetite:  Good  Current Medications: Current Facility-Administered Medications  Medication Dose Route Frequency Provider Last Rate Last Dose  . alum & mag hydroxide-simeth (MAALOX/MYLANTA) 200-200-20 MG/5ML suspension 30 mL  30 mL Oral Q4H PRN Okonkwo, Justina A, NP      . FLUoxetine (PROZAC) capsule 20 mg  20 mg Oral Daily Leata Mouse, MD   20 mg at 11/07/16 0813  . hydrOXYzine (ATARAX/VISTARIL) tablet 25 mg  25 mg Oral TID PRN Ferne Reus A, NP   25 mg at 11/07/16 0814  . magnesium hydroxide (MILK OF MAGNESIA) suspension 30 mL  30 mL Oral Daily PRN Okonkwo, Justina A, NP      . traZODone (DESYREL) tablet 50 mg  50 mg Oral QHS PRN Beryle Lathe, Justina A, NP   50 mg at 11/06/16 2202    Lab Results:  Results for orders  placed or performed during the hospital encounter of 11/04/16 (from the past 48 hour(s))  Hemoglobin A1c     Status: None   Collection Time: 11/06/16  5:57 AM  Result Value Ref Range   Hgb A1c MFr Bld 5.3 4.8 - 5.6 %    Comment: (NOTE)         Pre-diabetes: 5.7 - 6.4         Diabetes: >6.4         Glycemic control for adults with diabetes: <7.0    Mean Plasma Glucose 105 mg/dL    Comment: (NOTE) Performed At: [facility] [address] [place], [place] [id number] Mila HomerHancock William F MD NW:[phone] Performed at [facility], [address] [place], [place] [address]   Prolactin     Status: Abnormal   Collection Time: 11/06/16   5:57 AM  Result Value Ref Range   Prolactin 19.1 (H) 4.0 - 15.2 ng/mL    Comment: (NOTE) Performed At: [facility] [address] [place], [place] [id number] Mila HomerHancock William F MD LK:[phone] Performed at [facility], [address] [place], [place] [address]   Lipid panel     Status: None   Collection Time: 11/06/16  5:57 AM  Result Value Ref Range   Cholesterol 123 0 - 200 mg/dL   Triglycerides 74 <347<150 mg/dL   HDL 46 >42>40 mg/dL   Total CHOL/HDL Ratio 2.7 RATIO   VLDL 15 0 - 40 mg/dL   LDL Cholesterol 62 0 - 99 mg/dL    Comment:        Total Cholesterol/HDL:CHD Risk Coronary Heart Disease Risk Table                     Men   Women  1/2 Average Risk   3.4   3.3  Average Risk       5.0   4.4  2 X Average Risk   9.6   7.1  3 X Average Risk  23.4   11.0        Use the calculated Patient Ratio above and the CHD Risk Table to determine the patient's CHD Risk.        ATP III CLASSIFICATION (LDL):  <100     mg/dL   Optimal  [id number]  mg/dL   Near or Above                    Optimal  130-159  mg/dL   Borderline  [id number]  mg/dL   High  >295>190     mg/dL   Very High Performed at [facility], [address] [place], [place] [address]     Blood Alcohol level:  Lab Results  Component Value Date   ETH <5 11/01/2016    Metabolic Disorder Labs: Lab Results  Component Value Date   HGBA1C 5.3 11/06/2016   MPG 105 11/06/2016   Lab Results  Component Value Date   PROLACTIN 19.1 (H) 11/06/2016   Lab Results  Component Value Date   CHOL 123 11/06/2016   TRIG 74 11/06/2016   HDL 46 11/06/2016   CHOLHDL 2.7 11/06/2016   VLDL 15 11/06/2016   LDLCALC 62 11/06/2016    Physical Findings: AIMS: Facial and Oral Movements Muscles of Facial Expression: None, normal Lips and Perioral Area: None, normal Jaw: None, normal Tongue: None, normal,Extremity Movements Upper (arms, wrists, hands, fingers): None, normal Lower (legs, knees, ankles,  toes): None, normal,  Trunk Movements Neck, shoulders, hips: None, normal, Overall Severity Severity of abnormal movements (highest score from questions above): None, normal Incapacitation due to abnormal movements: None, normal Patient's awareness of abnormal movements (rate only patient's report): No Awareness, Dental Status Current problems with teeth and/or dentures?: No Does patient usually wear dentures?: No  CIWA:    COWS:     Musculoskeletal: Strength & Muscle Tone: within normal limits Gait & Station: normal Patient leans: N/A  Psychiatric Specialty Exam: Physical Exam  Nursing note and vitals reviewed. Constitutional: He is oriented to person, place, and time. He appears well-developed and well-nourished.  Musculoskeletal: Normal range of motion.  Neurological: He is alert and oriented to person, place, and time.    Review of Systems  Constitutional: Negative.   HENT: Negative.   Eyes: Negative.   Respiratory: Negative.   Cardiovascular: Negative.   Genitourinary: Negative.   Musculoskeletal: Negative.   Skin: Negative.   Neurological: Negative.   Endo/Heme/Allergies: Negative.   Psychiatric/Behavioral: Positive for depression and substance abuse. Negative for hallucinations, memory loss and suicidal ideas. The patient is nervous/anxious. The patient does not have insomnia.   All other systems reviewed and are negative.   Blood pressure 133/90, pulse (!) 54, temperature 98.6 F (37 C), temperature source Oral, resp. rate 20, height 5\' 7"  (1.702 m), weight 135 lb (61.2 kg), SpO2 100 %.Body mass index is 21.14 kg/m.  General Appearance: Casual  Eye Contact:  Good  Speech:  Clear and Coherent  Volume:  Normal  Mood:  Euthymic  Affect:  Appropriate  Thought Process:  Coherent and Descriptions of Associations: Intact  Orientation:  Full (Time, Place, and Person)  Thought Content:  WDL denies AVH, no ruminations, no preoccupations.   Suicidal Thoughts:  No   Homicidal Thoughts:  No  Memory:  Immediate;   Good Recent;   Good  Judgement:  Fair  Insight:  Fair  Psychomotor Activity:  Normal  Concentration:  Concentration: Good and Attention Span: Good  Recall:  Good  Fund of Knowledge:  Good  Language:  Good  Akathisia:  No  Handed:  Right  AIMS (if indicated):     Assets:  Financial Resources/Insurance Housing Social Support  ADL's:  Intact  Cognition:  WNL  Sleep:  Number of Hours: 6.75     Treatment Plan Summary: Reviewed current treatment plan. I agree to current plan and will continue the following without adjustments;  Daily contact with patient to assess and evaluate symptoms and progress in treatment, Medication management and Plan is to:  -Continue Prozac 20 mg PO Daily for mood stability -Continue Vistaril 25 mg po TID PRN for anxiety -Continue Trazodone 50 mg  PRN for insomnia -Encourage group therapy participation -Discharge: [facility]    Denzil MagnusonLaShunda Dorothye Berni, NP 11/07/2016, 10:40 AM    Patient ID: George AbrahamsNick Quinn, male   DOB: 26-Nov-1980, 36 y.o.   MRN: [id number]

## 2016-11-07 NOTE — [facility] Suicide Risk Assessment (Signed)
[facility] INPATIENT:  Family/Significant Other Suicide Prevention Education  Suicide Prevention Education:  Education Completed; Tammy Cousino (pt's mother) [phone]  has been identified by the patient as the family member/significant other with whom the patient will be residing, and identified as the person(s) who will aid the patient in the event of a mental health crisis (suicidal ideations/suicide attempt).  With written consent from the patient, the family member/significant other has been provided the following suicide prevention education, prior to the and/or following the discharge of the patient.  The suicide prevention education provided includes the following:  Suicide risk factors  Suicide prevention and interventions  National Suicide Hotline telephone number  [facility] assessment telephone number  [facility] Emergency Assistance 911  [facility] and/or Residential Mobile Crisis Unit telephone number  Request made of family/significant other to:  Remove weapons (e.g., guns, rifles, knives), all items previously/currently identified as safety concern.    Remove drugs/medications (over-the-counter, prescriptions, illicit drugs), all items previously/currently identified as a safety concern.  The family member/significant other verbalizes understanding of the suicide prevention education information provided.  The family member/significant other agrees to remove the items of safety concern listed above.  Pt's mother happy that pt was pursuing inpatient treatment. She had no further questions or concerns. SPE reviewed with pt.   Heather N Smart LCSW 11/07/2016, 2:07 PM

## 2016-11-07 NOTE — Progress Notes (Addendum)
  [facility] Management Discharge Plan :  Will you be returning to the same living situation after discharge:  No, pt plans to [facility].  At discharge, do you have transportation home?: Yes,  taxi voucher in chart-Pt scheduled for Wed discharge at 7:00AM viat taxi [facility]. PATIENT DISCHARGE RESCHEDULED FOR Thursday AT 7:00am. SAME PLAN.  Do you have the ability to pay for your medications: Yes  Release of information consent forms completed and submitted to medical records by CSW.  Patient to Follow up at: Follow-up Information    [facility] Follow up.   Specialty:  Behavioral Health Why:  Walk in within 7 days of hospital/rehab discharge to be assessed for outpatient mental health services including: medication management; counseling, and support groups. Thank you.  Contact information: [address] [place] [address] [phone]        [facility] Follow up on 11/09/2016.   Why:  Screening for possible admission on Thursday at 7:45AM. Please bring: photo ID/proof [place] residency, medications/prescriptions provided by the hospital. Thank you.  Contact information: [place] [place] [address] [phone]           Next level of care provider has access to [facility] Link:no  Safety Planning and Suicide Prevention discussed: Yes,  SPE completed with pt's mother. SPI pamphlet and Mobile Crisis information provided to pt.   Have you used any form of tobacco in the last 30 days? (Cigarettes, Smokeless Tobacco, Cigars, and/or Pipes): Yes  Has patient been referred to the Quitline?: Patient refused referral  Patient has been referred for addiction treatment: Yes  Pulte HomesHeather N Smart, LCSW 11/07/2016, 2:08 PM

## 2016-11-08 NOTE — Progress Notes (Signed)
Adult Psychoeducational Group Note  Date:  11/08/2016 Time:  12:21 AM  Group Topic/Focus:  Wrap-Up Group:   The focus of this group is to help patients review their daily goal of treatment and discuss progress on daily workbooks.  Participation Level:  Active  Participation Quality:  Appropriate  Affect:  Appropriate  Cognitive:  Appropriate  Insight: Appropriate and Improving  Engagement in Group:  Engaged  Modes of Intervention:  Discussion  Additional Comments: Pt stated his goals for today where to talk with his doctor about his discharge plan, and work on his nutrition. Pt stated he felt good when he accomplished both of his goals. Pt rated his day a 9.6. Pt stated his visited with his mother help in him have a positive day.  Felipa FurnaceChristopher  Marshall 11/08/2016, 12:21 AM

## 2016-11-08 NOTE — Progress Notes (Signed)
D: Pt was in dayroom upon initial approach.  Pt presents with anxious affect and mood.  Describes his day as "good" and reports goal was to "get [facility]  Pt reports [facility] is [facility] and he feels safe to do so.  Pt denies SI/HI, denies hallucinations, reports upper L tooth pain of 6/10.  Pt has been visible in milieu interacting with peers and staff appropriately.  Pt attended evening group.    A: Introduced self to pt.  Actively listened to pt and offered support and encouragement. PRN medication administered for tooth pain, anxiety, and sleep.  Q15 minute safety checks maintained.  R: Pt is safe on the unit.  Pt is compliant with medications.  Pt verbally contracts for safety.  Will continue to monitor and assess.

## 2016-11-08 NOTE — Progress Notes (Signed)
Recreation Therapy Notes  Date:11/08/2016 Time:9:30am Location: 300 [address] Dayroom  Group Topic: Stress Management  Goal Area(s) Addresses:  Patient will verbalize importance of using healthy stress management.  Patient will identify positive emotions associated with healthy stress management.   Intervention: Stress Management  Activity: Visual Relaxation. Recreation Therapy Intern introduced the stress management technique of visual relaxation. Recreation Therapy Intern read a script that allowed patients to take mental trip to the beach. Recreation Therapy Intern played calming beach wave sounds. Patients were to follow along as script was read to engage in the activity.  Education:Stress Management, Discharge Planning.   Education Outcome:Needs additional education  Clinical Observations/Feedback:Pt did not attend group.  Rachel Meyer, Recreation Therapy Intern  Marjette Lindsay, LRT/CTRS 

## 2016-11-08 NOTE — Progress Notes (Signed)
Pt unable to discharge this morning/SRA not completed. [facility] is willing to screen patient Thursday morning 11/09/16 at 7:45AM. Discharge rescheduled for Thursday morning.  Trula SladeHeather Smart, MSW, LCSW Clinical Social Worker 11/08/2016 8:18 AM

## 2016-11-08 NOTE — [facility] Suicide Risk Assessment (Addendum)
[facility] Discharge Suicide Risk Assessment   Principal Problem: MDD (major depressive disorder) Discharge Diagnoses:  Patient Active Problem List   Diagnosis Date Noted  . MDD (major depressive disorder) [F32.9] 11/04/2016  . Overdose by acetaminophen [T39.1X1A] 11/02/2016  . Suicidal behavior [R46.89] 11/02/2016  . Abnormal LFTs [R94.5] 11/02/2016  . Alcohol abuse [F10.10] 11/02/2016  . Bradycardia [R00.1] 11/02/2016  . Drug overdose [T50.901A] 11/02/2016  . Tobacco abuse [Z72.0]   . Polysubstance abuse [F19.10]     Total Time spent with patient: 45 minutes  Musculoskeletal: Strength & Muscle Tone: within normal limits Gait & Station: normal Patient leans: N/A  Psychiatric Specialty Exam: Review of Systems  Constitutional: Negative.   HENT: Negative.   Eyes: Negative.   Respiratory: Negative.   Cardiovascular: Negative.   Gastrointestinal: Negative.   Genitourinary: Negative.   Musculoskeletal: Negative.   Skin: Negative.   Neurological: Negative.   Endo/Heme/Allergies: Negative.   Psychiatric/Behavioral: Negative for depression, hallucinations, memory loss and suicidal ideas. The patient is not nervous/anxious and does not have insomnia.     Blood pressure 122/87, pulse 62, temperature 97.8 F (36.6 C), temperature source Oral, resp. rate 16, height 5' 7"  (1.702 m), weight 61.2 kg (135 lb), SpO2 100 %.Body mass index is 21.14 kg/m.  General Appearance: Neatly dressed, pleasant, engaging well and cooperative. Appropriate behavior. Not in any distress. Good relatedness. Not internally stimulated  Eye Contact::  Good  Speech:  Clear and Coherent and Normal Rate409  Volume:  Normal  Mood:  Euthymic  Affect:  Appropriate and Full Range  Thought Process:  Linear  Orientation:  Full (Time, Place, and Person)  Thought Content:  No delusional theme. No preoccupation with violent thoughts. No negative ruminations. No obsession.  No hallucination in any modality.   Suicidal  Thoughts:  No  Homicidal Thoughts:  No  Memory:  Immediate;   Good Recent;   Good Remote;   Good  Judgement:  Good  Insight:  Good  Psychomotor Activity:  Normal  Concentration:  Good  Recall:  Good  Fund of Knowledge:Good  Language: Good  Akathisia:  Negative  Handed:    AIMS (if indicated):     Assets:  Communication Skills Desire for Improvement Resilience  Sleep:  Number of Hours: 6.75  Cognition: WNL  ADL's:  Intact   Clinical Assessment::   36 yo Caucasian male. [facility] medical floor. Prested to the ER two days after he took an OD of percocet. Was intoxicated with alcohol during the OD. Slept it off in a ditch, went home and informed his mom what happened. Presented in company of his mom. Has been started on antidepressants. Engaged with unit groups and motivated to get into rehab.   Chart reviewed today. Patient discussed at team I met with him for the first time today. History of substance use since childhood. Reports history of emotional abuse. Reports that he is in good spirits. Not feeling depressed. Reports normal energy and interest. Has been maintaining normal biological functions. He is able to think clearly. He is able to focus on task. His thoughts are not crowded or racing. No evidence of mania. No hallucination in any modality. He is not making any delusional statement. No passivity of will/thought. He is fully in touch with reality. No thoughts of suicide. No thoughts of homicide. No violent thoughts. No overwhelming anxiety. No craving for substances. Looking forward to rehab.   Nursing staff reports that he has been appropriate. He has not voiced  any suicidal thoughts. No behavioral issues.  Patient was discussed at team. Team members feels that patient is back to his baseline level of function. Team agrees with plan to discharge patient today.   Demographic Factors:  Male  Loss Factors: NA  Historical Factors: Impulsivity  Risk  Reduction Factors:   Sense of responsibility to family, Living with another person, especially a relative, Positive social support, Positive therapeutic relationship and Positive coping skills or problem solving skills  Continued Clinical Symptoms:  As above   Cognitive Features That Contribute To Risk:  None    Suicide Risk:  Minimal: No identifiable suicidal ideation. Patient is not having any thoughts of suicide at this time. Modifiable risk factors targeted during this admission includes depression and substance use. Demographical and historical risk factors cannot be modified. Patient is now engaging well. Patient is reliable and is future oriented. We have buffered patient's support structures. At this point, patient is at low risk of suicide. Patient is aware of the effects of psychoactive substances on decision making process. Patient has been provided with emergency contacts. Patient acknowledges to use resources provided if unforseen circumstances changes their current risk stratification.    Follow-up Information    [facility] Follow up.   Specialty:  Behavioral Health Why:  Walk in within 7 days of hospital/rehab discharge to be assessed for outpatient mental health services including: medication management; counseling, and support groups. Thank you.  Contact information: [place] [address] [phone]        [facility] Follow up on 11/09/2016.   Why:  Screening for possible admission on Thursday at 7:45AM. Please bring: photo ID/proof of [employer] residency, medications/prescriptions provided by the hospital. Thank you.  Contact information: [place] [address] [phone]           Plan Of Care/Follow-up recommendations:  1. Continue current psychotropic medications 2. Mental health and addiction follow up as arranged.  3. Provided limited quantity of prescriptions   [place], MD 11/08/2016,  5:38 PM

## 2016-11-08 NOTE — Plan of Care (Signed)
Problem: Safety: Goal: Periods of time without injury will increase Outcome: Progressing Pt has not harmed self or others tonight.  He denies SI/HI and verbally contracts for safety.   

## 2016-11-08 NOTE — Progress Notes (Signed)
Patient ID: George Quinn, male   DOB: [birth year], 36 y.o.   MRN: [id number]  DAR: Pt. Denies SI/HI and A/V Hallucinations. He reports sleep is fair, energy level is normal, appetite is good, and concentration is good. He rates depression 6/10, hopelessness 6/10, and anxiety 8/10. Patient does not report any pain or discomfort at this time. Support and encouragement provided to the patient. Scheduled medication administered to patient per physician's orders.Patient is minimal with staff but cooperative. He is seen in the milieu and is interacting with peers. He reports he feels ready for discharge tomorrow morning. Q15 minute checks are maintained for safety.

## 2016-11-08 NOTE — Progress Notes (Signed)
Pt attended NA meeting this evening.  

## 2016-11-09 NOTE — Plan of Care (Signed)
Problem: Activity: Goal: Sleeping patterns will improve Outcome: Progressing Slept 6.75 hours last night according to flowsheet.    

## 2016-11-09 NOTE — Progress Notes (Signed)
Patient ID: George Quinn, male   DOB: 12-17-1980, 36 y.o.   MRN: [id number] Pt denies SI/HI, denies hallucinations, denies pain.  He verbally contracts for safety.  Pt provided with belongings from locker 52.  AVS reviewed with pt, pt verbalized understanding.  Pt provided with 2 week supply of medication and prescriptions.  Pt ambulatory and in no distress.  [facility].

## 2016-11-09 NOTE — Progress Notes (Signed)
D: Pt was in the dayroom upon initial approach.  Pt presents with appropriate affect and anxious mood.  [facility]  He is discharging in the morning and reports he feels safe to do so.  Pt denies SI/HI, denies hallucinations, reports pain from headache of 6/10.  Pt has been visible in milieu interacting with peers and staff appropriately.  Pt attended evening group.    A: Introduced self to pt.  Actively listened to pt and offered support and encouragement.  PRN medication administered for anxiety, pain, and sleep.  Q15 minute safety checks maintained.  R: Pt is safe on the unit.  Pt is compliant with medications.  Pt verbally contracts for safety.  Will continue to monitor and assess.

## 2016-12-28 ENCOUNTER — Emergency Department (HOSPITAL_BASED_OUTPATIENT_CLINIC_OR_DEPARTMENT_OTHER)
Admission: EM | Admit: 2016-12-28 | Discharge: 2016-12-28 | Disposition: A | Discharge status: Home / Self Care | Payer: Self-pay | Attending: Emergency Medicine | Admitting: Emergency Medicine

## 2016-12-28 ENCOUNTER — Encounter (HOSPITAL_BASED_OUTPATIENT_CLINIC_OR_DEPARTMENT_OTHER): Payer: Self-pay | Admitting: Emergency Medicine

## 2016-12-28 DIAGNOSIS — Z79899 Other long term (current) drug therapy: Secondary | ICD-10-CM | POA: Insufficient documentation

## 2016-12-28 DIAGNOSIS — K029 Dental caries, unspecified: Secondary | ICD-10-CM | POA: Insufficient documentation

## 2016-12-28 DIAGNOSIS — F17200 Nicotine dependence, unspecified, uncomplicated: Secondary | ICD-10-CM | POA: Insufficient documentation

## 2016-12-28 HISTORY — DX: Other chronic pain: G89.29

## 2016-12-28 HISTORY — DX: Cervicalgia: M54.2

## 2016-12-28 MED ORDER — TRAMADOL HCL 50 MG PO TABS
50.0000 mg | ORAL_TABLET | Freq: Once | ORAL | Status: AC
  Administered 2016-12-28: 50 mg via ORAL
  Filled 2016-12-28: qty 1

## 2016-12-28 MED ORDER — TRAMADOL HCL 50 MG PO TABS: 50.0000 mg | ORAL_TABLET | Freq: Four times a day (QID) | ORAL | 0 refills | Status: AC | PRN

## 2016-12-28 MED ORDER — AMOXICILLIN 500 MG PO CAPS: 1000.0000 mg | ORAL_CAPSULE | Freq: Two times a day (BID) | ORAL | 0 refills | Status: AC

## 2016-12-28 MED ORDER — AMOXICILLIN 500 MG PO CAPS
1000.0000 mg | ORAL_CAPSULE | Freq: Once | ORAL | Status: AC
  Administered 2016-12-28: 1000 mg via ORAL
  Filled 2016-12-28: qty 2

## 2016-12-28 NOTE — Discharge Instructions (Signed)
Continue taking ibuprofen for pain - reserve tramadol for more severe pain. You may also take acetaminophen for additional pain relief.

## 2016-12-28 NOTE — ED Triage Notes (Signed)
Patient states that he is having pain to the back of his mouth. The patient reports that he has a broken tooth on the left side of his mouth. Reports that he is a patient at [facility]

## 2016-12-28 NOTE — ED Notes (Signed)
ED Provider at bedside. 

## 2016-12-28 NOTE — ED Notes (Signed)
Updated to wait time 

## 2016-12-28 NOTE — ED Provider Notes (Signed)
[facility]   CSN: [id number] Arrival date & time: 12/28/16  1845     History   Chief Complaint Chief Complaint  Patient presents with  . Dental Pain    HPI George Quinn is a 36 y.o. male.  The history is provided by the patient.  He states that a tooth on the left side upper broke off about 2 weeks ago. He has had pain in that spot since. Over the last week, he has also had pain in the right upper teeth. Pain is worse when eating hot or cold food. He rates pain at 8/10. He has been taking ibuprofen with slight relief. He is status post wisdom teeth extraction.  Past Medical History:  Diagnosis Date  . Alcohol abuse   . Chronic neck pain   . HCV (hepatitis C virus)   . Hepatitis C   . Polysubstance abuse   . Tobacco abuse     Patient Active Problem List   Diagnosis Date Noted  . MDD (major depressive disorder) 11/04/2016  . Overdose by acetaminophen 11/02/2016  . Suicidal behavior 11/02/2016  . Abnormal LFTs 11/02/2016  . Alcohol abuse 11/02/2016  . Bradycardia 11/02/2016  . Drug overdose 11/02/2016  . Tobacco abuse   . Polysubstance abuse     History reviewed. No pertinent surgical history.     Home Medications    Prior to Admission medications   Medication Sig Start Date End Date Taking? Authorizing Provider  gabapentin (NEURONTIN) 300 MG capsule Take 300 mg by mouth 3 (three) times daily.   Yes [provider]  FLUoxetine (PROZAC) 20 MG capsule Take 1 capsule (20 mg total) by mouth daily. 11/08/16   Denzil Magnuson, NP  hydrOXYzine (ATARAX/VISTARIL) 25 MG tablet Take 1 tablet (25 mg total) by mouth 3 (three) times daily as needed for anxiety. 11/07/16   Denzil Magnuson, NP  traZODone (DESYREL) 50 MG tablet Take 1 tablet (50 mg total) by mouth at bedtime as needed for sleep. 11/07/16   Denzil Magnuson, NP    Family History Family History  Problem Relation Age of Onset  . Diabetes Mellitus II Mother   . Thyroid disease  Mother     Social History Social History  Substance Use Topics  . Smoking status: Current Every Day Smoker  . Smokeless tobacco: Never Used  . Alcohol use No     Comment: clean since aug.      Allergies   Patient has no known allergies.   Review of Systems Review of Systems  All other systems reviewed and are negative.    Physical Exam Updated Vital Signs BP 132/80 (BP Location: Left Arm)   Pulse 69   Temp 98.5 F (36.9 C) (Oral)   Resp 18   Ht  (1.702 m)   Wt 74.8 kg (165 lb)   SpO2 100%   BMI 25.84 kg/m   Physical Exam  Nursing note and vitals reviewed.  36 year old male, resting comfortably and in no acute distress. Vital signs are normal. Oxygen saturation is 100%, which is normal. Head is normocephalic and atraumatic. PERRLA, EOMI. Oropharynx is clear. There is tenderness to palpation over the right maxillary sinus. Teeth number on it 2 and 3 have carious and are tender to percussion. There is no underlying gingival swelling or pallor. Tooth #15 is routed to about the gingival line. Tooth #14 has a shell of enamel, but the entire central portion of the tooth is now missing. There  is no gingival swelling or tenderness. There is tenderness percussion over both of these teeth. Neck is nontender and supple without adenopathy or JVD. Back is nontender and there is no CVA tenderness. Lungs are clear without rales, wheezes, or rhonchi. Chest is nontender. Heart has regular rate and rhythm without murmur. Abdomen is soft, flat, nontender without masses or hepatosplenomegaly and peristalsis is normoactive. Extremities have no cyanosis or edema, full range of motion is present. Skin is warm and dry without rash. Neurologic: Mental status is normal, cranial nerves are intact, there are no motor or sensory deficits.  ED Treatments / Results   Procedures Procedures (including critical care time)  Medications Ordered in ED Medications  amoxicillin (AMOXIL)  capsule 1,000 mg (not administered)  traMADol (ULTRAM) tablet 50 mg (not administered)     Initial Impression / Assessment and Plan / ED Course  I have reviewed the triage vital signs and the nursing notes.  Dental caries with dental pain. He is given dental resource guide and is discharged with prescriptions for amoxicillin and tramadol. Old records are reviewed, and he has no relevant past visits.  Final Clinical Impressions(s) / ED Diagnoses   Final diagnoses:  Dental caries    New Prescriptions New Prescriptions   AMOXICILLIN (AMOXIL) 500 MG CAPSULE    Take 2 capsules (1,000 mg total) by mouth 2 (two) times daily.   TRAMADOL (ULTRAM) 50 MG TABLET    Take 1 tablet (50 mg total) by mouth every 6 (six) hours as needed.     Dione Booze, MD 12/28/16 2320

## 2018-02-11 DIAGNOSIS — F3132 Bipolar disorder, current episode depressed, moderate: Secondary | ICD-10-CM | POA: Diagnosis not present

## 2018-03-25 DIAGNOSIS — F3132 Bipolar disorder, current episode depressed, moderate: Secondary | ICD-10-CM | POA: Diagnosis not present

## 2018-07-22 DIAGNOSIS — F1020 Alcohol dependence, uncomplicated: Secondary | ICD-10-CM | POA: Diagnosis not present

## 2018-07-22 DIAGNOSIS — F411 Generalized anxiety disorder: Secondary | ICD-10-CM | POA: Diagnosis not present

## 2018-07-22 DIAGNOSIS — F4310 Post-traumatic stress disorder, unspecified: Secondary | ICD-10-CM | POA: Diagnosis not present

## 2018-07-22 DIAGNOSIS — F3132 Bipolar disorder, current episode depressed, moderate: Secondary | ICD-10-CM | POA: Diagnosis not present

## 2019-04-07 ENCOUNTER — Ambulatory Visit: Payer: HRSA Program | Attending: Internal Medicine

## 2019-04-07 DIAGNOSIS — U071 COVID-19: Secondary | ICD-10-CM | POA: Diagnosis not present

## 2019-04-07 DIAGNOSIS — Z20822 Contact with and (suspected) exposure to covid-19: Secondary | ICD-10-CM

## 2019-04-07 DIAGNOSIS — Z20828 Contact with and (suspected) exposure to other viral communicable diseases: Secondary | ICD-10-CM | POA: Diagnosis present

## 2019-04-09 LAB — NOVEL CORONAVIRUS, NAA: SARS-CoV-2, NAA: DETECTED — AB

## 2019-09-23 DIAGNOSIS — F419 Anxiety disorder, unspecified: Secondary | ICD-10-CM | POA: Diagnosis not present

## 2020-07-24 ENCOUNTER — Emergency Department (HOSPITAL_COMMUNITY): Payer: No Typology Code available for payment source

## 2020-07-24 ENCOUNTER — Other Ambulatory Visit: Payer: Self-pay

## 2020-07-24 ENCOUNTER — Inpatient Hospital Stay (HOSPITAL_COMMUNITY)
Admission: EM | Admit: 2020-07-24 | Discharge: 2020-07-26 | DRG: 964 | Disposition: A | Discharge status: Home / Self Care | Payer: No Typology Code available for payment source | Attending: Surgery | Admitting: Surgery

## 2020-07-24 ENCOUNTER — Encounter (HOSPITAL_COMMUNITY): Payer: Self-pay | Admitting: Emergency Medicine

## 2020-07-24 DIAGNOSIS — S12201A Unspecified nondisplaced fracture of third cervical vertebra, initial encounter for closed fracture: Secondary | ICD-10-CM

## 2020-07-24 DIAGNOSIS — S01119A Laceration without foreign body of unspecified eyelid and periocular area, initial encounter: Secondary | ICD-10-CM

## 2020-07-24 DIAGNOSIS — F1510 Other stimulant abuse, uncomplicated: Secondary | ICD-10-CM | POA: Diagnosis present

## 2020-07-24 DIAGNOSIS — S27329A Contusion of lung, unspecified, initial encounter: Secondary | ICD-10-CM

## 2020-07-24 DIAGNOSIS — S022XXB Fracture of nasal bones, initial encounter for open fracture: Secondary | ICD-10-CM | POA: Diagnosis present

## 2020-07-24 DIAGNOSIS — S0240CA Maxillary fracture, right side, initial encounter for closed fracture: Secondary | ICD-10-CM

## 2020-07-24 DIAGNOSIS — Z20822 Contact with and (suspected) exposure to covid-19: Secondary | ICD-10-CM | POA: Diagnosis present

## 2020-07-24 DIAGNOSIS — S12200A Unspecified displaced fracture of third cervical vertebra, initial encounter for closed fracture: Secondary | ICD-10-CM | POA: Diagnosis present

## 2020-07-24 DIAGNOSIS — S36115A Moderate laceration of liver, initial encounter: Secondary | ICD-10-CM

## 2020-07-24 DIAGNOSIS — S02400A Malar fracture unspecified, initial encounter for closed fracture: Secondary | ICD-10-CM

## 2020-07-24 DIAGNOSIS — S27321A Contusion of lung, unilateral, initial encounter: Secondary | ICD-10-CM | POA: Diagnosis present

## 2020-07-24 DIAGNOSIS — Z91018 Allergy to other foods: Secondary | ICD-10-CM

## 2020-07-24 DIAGNOSIS — S129XXA Fracture of neck, unspecified, initial encounter: Secondary | ICD-10-CM | POA: Diagnosis present

## 2020-07-24 DIAGNOSIS — Y92410 Unspecified street and highway as the place of occurrence of the external cause: Secondary | ICD-10-CM | POA: Diagnosis not present

## 2020-07-24 DIAGNOSIS — F1110 Opioid abuse, uncomplicated: Secondary | ICD-10-CM | POA: Diagnosis present

## 2020-07-24 DIAGNOSIS — S01111A Laceration without foreign body of right eyelid and periocular area, initial encounter: Secondary | ICD-10-CM | POA: Diagnosis present

## 2020-07-24 DIAGNOSIS — S022XXA Fracture of nasal bones, initial encounter for closed fracture: Secondary | ICD-10-CM

## 2020-07-24 DIAGNOSIS — S80811A Abrasion, right lower leg, initial encounter: Secondary | ICD-10-CM | POA: Diagnosis present

## 2020-07-24 DIAGNOSIS — S02841A Fracture of lateral orbital wall, right side, initial encounter for closed fracture: Secondary | ICD-10-CM

## 2020-07-24 DIAGNOSIS — T07XXXA Unspecified multiple injuries, initial encounter: Secondary | ICD-10-CM

## 2020-07-24 DIAGNOSIS — Z23 Encounter for immunization: Secondary | ICD-10-CM

## 2020-07-24 LAB — COMPREHENSIVE METABOLIC PANEL
ALT: 53 U/L — ABNORMAL HIGH (ref 0–44)
AST: 53 U/L — ABNORMAL HIGH (ref 15–41)
Albumin: 3.9 g/dL (ref 3.5–5.0)
Alkaline Phosphatase: 76 U/L (ref 38–126)
Anion gap: 8 (ref 5–15)
BUN: 13 mg/dL (ref 6–20)
CO2: 27 mmol/L (ref 22–32)
Calcium: 9.2 mg/dL (ref 8.9–10.3)
Chloride: 105 mmol/L (ref 98–111)
Creatinine, Ser: 0.85 mg/dL (ref 0.61–1.24)
GFR, Estimated: >60 mL/min (ref >60)
Glucose, Bld: 121 mg/dL — ABNORMAL HIGH (ref 70–99)
Potassium: 4.2 mmol/L (ref 3.5–5.1)
Sodium: 140 mmol/L (ref 135–145)
Total Bilirubin: 0.5 mg/dL (ref 0.3–1.2)
Total Protein: 6.8 g/dL (ref 6.5–8.1)

## 2020-07-24 LAB — CBC
HCT: 47.2 % (ref 39.0–52.0)
HCT: 42.6 % (ref 39.0–52.0)
HCT: 45.7 % (ref 39.0–52.0)
Hemoglobin: 15.2 g/dL (ref 13.0–17.0)
Hemoglobin: 14.1 g/dL (ref 13.0–17.0)
Hemoglobin: 14.3 g/dL (ref 13.0–17.0)
MCH: 27.5 pg (ref 26.0–34.0)
MCH: 27.7 pg (ref 26.0–34.0)
MCH: 27.6 pg (ref 26.0–34.0)
MCHC: 32.2 g/dL (ref 30.0–36.0)
MCHC: 33.1 g/dL (ref 30.0–36.0)
MCHC: 31.3 g/dL (ref 30.0–36.0)
MCV: 85.4 fL (ref 80.0–100.0)
MCV: 83.7 fL (ref 80.0–100.0)
MCV: 88.2 fL (ref 80.0–100.0)
Platelets: 208 10*3/uL (ref 150–400)
Platelets: 238 10*3/uL (ref 150–400)
Platelets: 244 10*3/uL (ref 150–400)
RBC: 5.53 MIL/uL (ref 4.22–5.81)
RBC: 5.09 MIL/uL (ref 4.22–5.81)
RBC: 5.18 MIL/uL (ref 4.22–5.81)
RDW: 12.6 % (ref 11.5–15.5)
RDW: 12.6 % (ref 11.5–15.5)
RDW: 12.7 % (ref 11.5–15.5)
WBC: 10.5 10*3/uL (ref 4.0–10.5)
WBC: 11.6 10*3/uL — ABNORMAL HIGH (ref 4.0–10.5)
WBC: 10.1 10*3/uL (ref 4.0–10.5)
nRBC: 0 % (ref 0.0–0.2)
nRBC: 0 % (ref 0.0–0.2)
nRBC: 0 % (ref 0.0–0.2)

## 2020-07-24 LAB — I-STAT CHEM 8, ED
BUN: 17 mg/dL (ref 6–20)
Calcium, Ion: 1.08 mmol/L — ABNORMAL LOW (ref 1.15–1.40)
Chloride: 106 mmol/L (ref 98–111)
Creatinine, Ser: 0.8 mg/dL (ref 0.61–1.24)
Glucose, Bld: 116 mg/dL — ABNORMAL HIGH (ref 70–99)
HCT: 44 % (ref 39.0–52.0)
Hemoglobin: 15 g/dL (ref 13.0–17.0)
Potassium: 4.4 mmol/L (ref 3.5–5.1)
Sodium: 140 mmol/L (ref 135–145)
TCO2: 31 mmol/L (ref 22–32)

## 2020-07-24 LAB — SAMPLE TO BLOOD BANK

## 2020-07-24 LAB — LACTIC ACID, PLASMA: Lactic Acid, Venous: 1.3 mmol/L (ref 0.5–1.9)

## 2020-07-24 LAB — CREATININE, SERUM
Creatinine, Ser: 0.7 mg/dL (ref 0.61–1.24)
GFR, Estimated: >60 mL/min (ref >60)

## 2020-07-24 LAB — ETHANOL: Alcohol, Ethyl (B): <10 mg/dL (ref <10)

## 2020-07-24 LAB — HIV ANTIBODY (ROUTINE TESTING W REFLEX): HIV Screen 4th Generation wRfx: NONREACTIVE

## 2020-07-24 LAB — RESP PANEL BY RT-PCR (FLU A&B, COVID) ARPGX2
Influenza A by PCR: NEGATIVE
Influenza B by PCR: NEGATIVE
SARS Coronavirus 2 by RT PCR: NEGATIVE

## 2020-07-24 LAB — PROTIME-INR
INR: 1 (ref 0.8–1.2)
Prothrombin Time: 13 seconds (ref 11.4–15.2)

## 2020-07-24 MED ORDER — LACTATED RINGERS IV SOLN: INTRAVENOUS | Status: DC

## 2020-07-24 MED ORDER — PROCHLORPERAZINE EDISYLATE 10 MG/2ML IJ SOLN: 5.0000 mg | Freq: Four times a day (QID) | INTRAMUSCULAR | Status: DC | PRN

## 2020-07-24 MED ORDER — CEFAZOLIN SODIUM-DEXTROSE 1-4 GM/50ML-% IV SOLN: 1.0000 g | Freq: Once | INTRAVENOUS | Status: DC

## 2020-07-24 MED ORDER — ACETAMINOPHEN 325 MG PO TABS
650.0000 mg | ORAL_TABLET | Freq: Four times a day (QID) | ORAL | Status: DC
  Administered 2020-07-24 – 2020-07-25 (×2): 650 mg via ORAL
  Filled 2020-07-24 (×2): qty 2

## 2020-07-24 MED ORDER — ONDANSETRON HCL 4 MG/2ML IJ SOLN: 4.0000 mg | Freq: Four times a day (QID) | INTRAMUSCULAR | Status: DC | PRN

## 2020-07-24 MED ORDER — OXYCODONE HCL 5 MG PO TABS: 5.0000 mg | ORAL_TABLET | ORAL | Status: DC | PRN

## 2020-07-24 MED ORDER — OXYCODONE HCL 5 MG PO TABS
10.0000 mg | ORAL_TABLET | ORAL | Status: DC | PRN
Start: 2020-07-24 — End: 2020-07-27
  Administered 2020-07-24 – 2020-07-26 (×4): 10 mg via ORAL
  Filled 2020-07-24 (×4): qty 2

## 2020-07-24 MED ORDER — PROCHLORPERAZINE MALEATE 10 MG PO TABS
10.0000 mg | ORAL_TABLET | Freq: Four times a day (QID) | ORAL | Status: DC | PRN
  Filled 2020-07-24: qty 1

## 2020-07-24 MED ORDER — IOHEXOL 300 MG/ML  SOLN
100.0000 mL | Freq: Once | INTRAMUSCULAR | Status: AC | PRN
  Administered 2020-07-24: 100 mL via INTRAVENOUS

## 2020-07-24 MED ORDER — HYDROMORPHONE HCL 1 MG/ML IJ SOLN
0.5000 mg | INTRAMUSCULAR | Status: DC | PRN
  Administered 2020-07-24 (×3): 0.5 mg via INTRAVENOUS
  Filled 2020-07-24 (×3): qty 1

## 2020-07-24 MED ORDER — TETANUS-DIPHTH-ACELL PERTUSSIS 5-2.5-18.5 LF-MCG/0.5 IM SUSY
0.5000 mL | PREFILLED_SYRINGE | Freq: Once | INTRAMUSCULAR | Status: AC
  Administered 2020-07-24: 0.5 mL via INTRAMUSCULAR
  Filled 2020-07-24: qty 0.5

## 2020-07-24 MED ORDER — CEFAZOLIN SODIUM-DEXTROSE 2-4 GM/100ML-% IV SOLN
2.0000 g | Freq: Once | INTRAVENOUS | Status: AC
  Administered 2020-07-24: 2 g via INTRAVENOUS
  Filled 2020-07-24: qty 100

## 2020-07-24 MED ORDER — ONDANSETRON 4 MG PO TBDP: 4.0000 mg | ORAL_TABLET | Freq: Four times a day (QID) | ORAL | Status: DC | PRN

## 2020-07-24 MED ORDER — GABAPENTIN 600 MG PO TABS
300.0000 mg | ORAL_TABLET | Freq: Three times a day (TID) | ORAL | Status: DC
  Administered 2020-07-24 – 2020-07-25 (×4): 300 mg via ORAL
  Filled 2020-07-24: qty 1
  Filled 2020-07-24 (×2): qty 0.5
  Filled 2020-07-24: qty 1
  Filled 2020-07-24 (×3): qty 0.5
  Filled 2020-07-24: qty 1

## 2020-07-24 MED ORDER — MORPHINE SULFATE (PF) 4 MG/ML IV SOLN
4.0000 mg | Freq: Once | INTRAVENOUS | Status: AC
  Administered 2020-07-24: 4 mg via INTRAVENOUS
  Filled 2020-07-24: qty 1

## 2020-07-24 MED ORDER — ENOXAPARIN SODIUM 30 MG/0.3ML ~~LOC~~ SOLN
30.0000 mg | Freq: Two times a day (BID) | SUBCUTANEOUS | Status: DC
  Administered 2020-07-26: 30 mg via SUBCUTANEOUS
  Filled 2020-07-24: qty 0.3
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
## 2020-07-24 NOTE — ED Notes (Signed)
Patient transported to CT 
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
## 2020-07-24 NOTE — ED Notes (Signed)
Aspen collar placed with [employer]. Pt verbalized comfort.
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
## 2020-07-24 NOTE — Progress Notes (Signed)
Patient ID: George Quinn, male   DOB: [birth year], 40 y.o.   MRN: [id number] Films reviewed, needs cervical collar all times. Will leave full consult

## 2020-07-24 NOTE — ED Notes (Signed)
Pt mother notified and updated about pt care.
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
## 2020-07-24 NOTE — ED Notes (Signed)
Pt verbalized understanding of urine sample needing to be collected. Pt states he is unable to urinate at this time

## 2020-07-24 NOTE — ED Notes (Signed)
Pt returned from CT. Pt alert and oriented X4

## 2020-07-24 NOTE — ED Notes (Signed)
Delay in CT due to lab draw
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
## 2020-07-24 NOTE — Progress Notes (Signed)
PT Cancellation Note  Patient Details Name: George Quinn MRN: [id number] DOB: 04/22/1980   Cancelled Treatment:    Reason Eval/Treat Not Completed: Pain limiting ability to participate;Active bedrest order. Will follow-up another day as appropriate.   Raymond Gurney, PT, DPT Acute Rehabilitation Services  Pager: [phone] Office: [phone]    Jewel Baize 07/24/2020, 5:48 PM

## 2020-07-25 ENCOUNTER — Inpatient Hospital Stay (HOSPITAL_COMMUNITY): Payer: No Typology Code available for payment source

## 2020-07-25 LAB — BASIC METABOLIC PANEL
Anion gap: 8 (ref 5–15)
BUN: 8 mg/dL (ref 6–20)
CO2: 26 mmol/L (ref 22–32)
Calcium: 8.9 mg/dL (ref 8.9–10.3)
Chloride: 104 mmol/L (ref 98–111)
Creatinine, Ser: 0.67 mg/dL (ref 0.61–1.24)
GFR, Estimated: >60 mL/min (ref >60)
Glucose, Bld: 103 mg/dL — ABNORMAL HIGH (ref 70–99)
Potassium: 3.8 mmol/L (ref 3.5–5.1)
Sodium: 138 mmol/L (ref 135–145)

## 2020-07-25 LAB — CBC
HCT: 40.4 % (ref 39.0–52.0)
Hemoglobin: 13.2 g/dL (ref 13.0–17.0)
MCH: 27.5 pg (ref 26.0–34.0)
MCHC: 32.7 g/dL (ref 30.0–36.0)
MCV: 84.2 fL (ref 80.0–100.0)
Platelets: 213 10*3/uL (ref 150–400)
RBC: 4.8 MIL/uL (ref 4.22–5.81)
RDW: 12.6 % (ref 11.5–15.5)
WBC: 9 10*3/uL (ref 4.0–10.5)
nRBC: 0 % (ref 0.0–0.2)

## 2020-07-25 MED ORDER — GABAPENTIN 300 MG PO CAPS
300.0000 mg | ORAL_CAPSULE | Freq: Three times a day (TID) | ORAL | Status: DC
  Administered 2020-07-25 – 2020-07-26 (×2): 300 mg via ORAL
  Filled 2020-07-25 (×2): qty 1

## 2020-07-25 MED ORDER — LIDOCAINE 5 % EX PTCH
1.0000 | MEDICATED_PATCH | CUTANEOUS | Status: DC
  Administered 2020-07-25 – 2020-07-26 (×2): 1 via TRANSDERMAL
  Filled 2020-07-25 (×2): qty 1

## 2020-07-25 MED ORDER — METHOCARBAMOL 500 MG PO TABS
1000.0000 mg | ORAL_TABLET | Freq: Three times a day (TID) | ORAL | Status: DC
  Administered 2020-07-25 – 2020-07-26 (×4): 1000 mg via ORAL
  Filled 2020-07-25 (×5): qty 2

## 2020-07-25 MED ORDER — LIDOCAINE HCL URETHRAL/MUCOSAL 2 % EX GEL
1.0000 "application " | Freq: Once | CUTANEOUS | Status: DC
  Filled 2020-07-25: qty 11

## 2020-07-25 MED ORDER — ACETAMINOPHEN 500 MG PO TABS
1000.0000 mg | ORAL_TABLET | Freq: Four times a day (QID) | ORAL | Status: DC
  Administered 2020-07-25 – 2020-07-26 (×5): 1000 mg via ORAL
  Filled 2020-07-25 (×5): qty 2

## 2020-07-25 MED ORDER — BUSPIRONE HCL 5 MG PO TABS: 10.0000 mg | ORAL_TABLET | Freq: Three times a day (TID) | ORAL | Status: DC | PRN

## 2020-07-25 MED ORDER — TRAMADOL HCL 50 MG PO TABS
50.0000 mg | ORAL_TABLET | Freq: Four times a day (QID) | ORAL | Status: DC
Start: 2020-07-25 — End: 2020-07-27
  Administered 2020-07-25 – 2020-07-26 (×5): 50 mg via ORAL
  Filled 2020-07-25 (×5): qty 1

## 2020-07-25 NOTE — Progress Notes (Signed)
OT Cancellation Note  Patient Details Name: George Quinn MRN: [id number] DOB: April 18, 1980   Cancelled Treatment:    Reason Eval/Treat Not Completed: Active bedrest order.  Eber Jones., OTR/L Acute Rehabilitation Services Pager [phone] Office [phone]   Jeani Hawking M 07/25/2020, 7:43 AM
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
## 2020-07-25 NOTE — Progress Notes (Signed)
Patient ID: George Quinn, male   DOB: 09/22/1980, 40 y.o.   MRN: [id number] BP (!) 140/93 (BP Location: Left Arm)   Pulse 70   Temp 98.4 F (36.9 C) (Oral)   Resp 11   Ht 5\' 7"  (1.702 m)   Wt 77.1 kg   SpO2 97%   BMI 26.63 kg/m  Alert and oriented x 4 Moving all extremities well Left grip painful, but moving all digits In cervical collar No new recommendations
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
## 2020-07-25 NOTE — Progress Notes (Signed)
Patient was amble to void in urinal. 600cc of dark amber urine emptied by RN. Brynda Rim, RN
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
## 2020-07-25 NOTE — Progress Notes (Signed)
RN wasted 300mg  of Tramadol due to patient dose being 300mg  and Pyxis supplied 600mg  tablets. , RN witnessed waste. Unable to resolve in Pyxis due to machine being down. , RN

## 2020-07-26 ENCOUNTER — Other Ambulatory Visit (HOSPITAL_COMMUNITY): Payer: Self-pay

## 2020-07-26 LAB — CBC
HCT: 38 % — ABNORMAL LOW (ref 39.0–52.0)
Hemoglobin: 12.8 g/dL — ABNORMAL LOW (ref 13.0–17.0)
MCH: 27.9 pg (ref 26.0–34.0)
MCHC: 33.7 g/dL (ref 30.0–36.0)
MCV: 82.8 fL (ref 80.0–100.0)
Platelets: 179 10*3/uL (ref 150–400)
RBC: 4.59 MIL/uL (ref 4.22–5.81)
RDW: 12.4 % (ref 11.5–15.5)
WBC: 5.7 10*3/uL (ref 4.0–10.5)
nRBC: 0 % (ref 0.0–0.2)

## 2020-07-26 LAB — BASIC METABOLIC PANEL
Anion gap: 9 (ref 5–15)
BUN: 8 mg/dL (ref 6–20)
CO2: 28 mmol/L (ref 22–32)
Calcium: 8.9 mg/dL (ref 8.9–10.3)
Chloride: 102 mmol/L (ref 98–111)
Creatinine, Ser: 0.75 mg/dL (ref 0.61–1.24)
GFR, Estimated: >60 mL/min (ref >60)
Glucose, Bld: 92 mg/dL (ref 70–99)
Potassium: 3.7 mmol/L (ref 3.5–5.1)
Sodium: 139 mmol/L (ref 135–145)

## 2020-07-26 MED ORDER — SALINE SPRAY 0.65 % NA SOLN: 1.0000 | NASAL | 0 refills | Status: AC | PRN

## 2020-07-26 MED ORDER — SALINE SPRAY 0.65 % NA SOLN
1.0000 | NASAL | Status: DC | PRN
  Filled 2020-07-26: qty 44

## 2020-07-26 MED ORDER — GABAPENTIN 300 MG PO CAPS
300.0000 mg | ORAL_CAPSULE | Freq: Three times a day (TID) | ORAL | 0 refills | Status: AC
  Filled 2020-07-26: qty 60, 20d supply, fill #0

## 2020-07-26 MED ORDER — TRAMADOL HCL 50 MG PO TABS
50.0000 mg | ORAL_TABLET | Freq: Four times a day (QID) | ORAL | 0 refills | Status: AC | PRN
  Filled 2020-07-26: qty 20, 5d supply, fill #0

## 2020-07-26 MED ORDER — ACETAMINOPHEN 500 MG PO TABS: 1000.0000 mg | ORAL_TABLET | Freq: Four times a day (QID) | ORAL | 0 refills | Status: AC | PRN

## 2020-07-26 MED ORDER — OXYCODONE HCL 5 MG PO TABS
5.0000 mg | ORAL_TABLET | Freq: Four times a day (QID) | ORAL | 0 refills | Status: AC | PRN
  Filled 2020-07-26: qty 20, 5d supply, fill #0

## 2020-07-26 MED ORDER — METHOCARBAMOL 500 MG PO TABS
1000.0000 mg | ORAL_TABLET | Freq: Three times a day (TID) | ORAL | 0 refills | Status: AC | PRN
  Filled 2020-07-26: qty 30, 5d supply, fill #0
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
## 2020-07-26 NOTE — Progress Notes (Signed)
Orthopedic Tech Progress Note Patient Details:  George Quinn 11-28-1980 [id number] RN called requesting a PHILLY COLLAR for patient. Dropped off to room patient was working with THERAPY at that time Ortho Devices Type of Ortho Device: [place] cervical collar Ortho Device/Splint Location: NECK Ortho Device/Splint Interventions: Other (comment)   Post Interventions Patient Tolerated: Well Instructions Provided: Care of device   Donald Pore 07/26/2020, 12:16 PM

## 2020-07-27 ENCOUNTER — Encounter (HOSPITAL_BASED_OUTPATIENT_CLINIC_OR_DEPARTMENT_OTHER): Payer: Self-pay | Admitting: Emergency Medicine

## 2020-09-10 ENCOUNTER — Other Ambulatory Visit: Payer: Self-pay

## 2020-09-10 ENCOUNTER — Ambulatory Visit (HOSPITAL_COMMUNITY)
Admission: EM | Admit: 2020-09-10 | Discharge: 2020-09-10 | Disposition: A | Discharge status: Home / Self Care | Payer: No Payment, Other

## 2020-09-10 DIAGNOSIS — F1910 Other psychoactive substance abuse, uncomplicated: Secondary | ICD-10-CM

## 2020-09-10 NOTE — ED Notes (Signed)
Pt received discharge instructions and stated understanding. Pt in lobby at time of d/c from facility. Pt alert, orient and ambulatory. Safety maintained.

## 2020-09-10 NOTE — Discharge Instructions (Addendum)
Patient is instructed prior to discharge to:  Take all medications as prescribed by his/her mental healthcare provider. Report any adverse effects and or reactions from the medicines to his/her outpatient provider promptly. Keep all scheduled appointments, to ensure that you are getting refills on time and to avoid any interruption in your medication.  If you are unable to keep an appointment call to reschedule.  Be sure to follow-up with resources and follow-up appointments provided.  Patient has been instructed & cautioned: To not engage in alcohol and or illegal drug use while on prescription medicines. In the event of worsening symptoms, patient is instructed to call the crisis hotline, 911 and or go to the nearest ED for appropriate evaluation and treatment of symptoms. To follow-up with his/her primary care provider for your other medical issues, concerns and or health care needs.    Substance Abuse Treatment Resources listed Below:  [facility] - Admissions are currently completed Monday through Friday at 8am; both appointments and walk-ins are accepted.  Any individual that is a [place] resident may present for a substance abuse screening and assessment for admission.  A person may be referred by numerous sources or self-refer.   Potential clients will be screened for medical necessity and appropriateness for the program.  Clients must meet criteria for high-intensity residential treatment services.  If clinically appropriate, a client will continue with the comprehensive clinical assessment and intake process, as well as enrollment in the [facility].  Address: [address] [place], [place] [address] Admin Hours: Mon-Fri 8AM to 5PM Center Hours: 24/7 Phone: [phone] Fax: [phone]  [facility] Address: [address], [place], [place] [address] Behavior[facility] Ur[facility] ([facility]) Hours: 24/7 Phone:  [phone] Fax: [phone]  [facility]): (offers outpatient therapy and intensive outpatient substance abuse therapy).  [address], [place], [place] [address] Phone: [phone]  [facility]: Offers FREE recovery skills classes, support groups, 1:1 Peer Support, and Compeer Classes. [address], [place], [place] [address] Phone: [phone] (Call to complete intake).   [facility] [address] [place], [place] [address] Phone: [phone] ext [phone] [facility] provides food, shelter and other programs and services to the homeless men of [place] through our men's program.  By offering safe shelter, three meals a day, clean clothing, Biblical counseling, financial planning, vocational training, GED/education and employment assistance, we've helped mend the shattered lives of many homeless men since opening in 1974.  We have approximately 267 beds available, with a max of 312 beds including mats for emergency situations and currently house an average of 270 men a night.  Prospective Client Check-In Information Photo ID Required (State/ Out of State/ DOC) - if photo ID is not available, clients are required to have a printout of a police/sheriff's criminal history report. Help out with chores around the Mission. No sex offender of any type (pending, charged, registered and/or any other sex related offenses) will be permitted to check in. Must be willing to abide by all rules, regulations, and policies established by the [facility]. The following will be provided - shelter, food, clothing, and biblical counseling. If you or someone you know is in need of assistance at our men's shelter in [place], [place], please call [phone] ext. [phone].  [facility]-will provide timely access to mental health services for children and adolescents (4-17) and adults    presenting in a mental health crisis. The program is designed for those who need urgent Behavioral Health or Substance Use treatment and are not experiencing a medical crisis that would typically require an emergency room visit.    [address] [place], [place] [address] Phone: [phone] [email or web address]  [facility]: Phone#: [phone]  The [facility] SA Intensive Outpatient Program (SAIOP) means structured individual and group addiction activities and services that are provided at an outpatient program designed to assist adult and adolescent consumers to begin recovery and learn skills for recovery maintenance. The [facility] SAIOP program is offered at least 3 hours a day, 3 days a week.SAIOP services shall include a structured program consisting of, but not limited to, the following services: Individual counseling and support; Group counseling and support; Family counseling, training or support; Biochemical assays to identify recent drug use (e.g., urine drug screens); Strategies for relapse prevention to include community and social support systems in treatment; Life skills; Crisis contingency planning; Disease Management; and Treatment support activities that have been adapted or specifically designed for persons with physical disabilities, or persons with co-occurring disorders of mental illness and substance abuse/dependence or mental retardation/developmental disability and substance abuse/dependence. Phone: [phone]  Address:   [facility] [place] [place] [facility] will also offer the following outpatient services: (Monday through Friday 8am-5pm)   Partial Hospitalization Program (PHP) Substance Abuse Intensive Outpatient Program (SA-IOP) Group Therapy Medication Management Peer Living Room We also provide (24/7):  Assessments: Our mental health clinician and providers will conduct a focused mental health evaluation, assessing for immediate  safety concerns and further mental health needs. Referral: Our team will provide resources and help connect to community based mental health treatment, when indicated, including psychotherapy, psychiatry, and other specialized behavioral health or substance use disorder services (for those not already in treatment). Transitional Care: Our team providers in person bridging and/or telephonic follow-up during the patient's transition to outpatient services.  [facility] 24-Hour Call Center: [phone] Behavioral Health Crisis Line: [phone]  
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
## 2020-10-25 ENCOUNTER — Encounter: Payer: Self-pay | Admitting: Family

## 2020-10-25 ENCOUNTER — Telehealth: Payer: Self-pay

## 2020-10-25 ENCOUNTER — Other Ambulatory Visit (HOSPITAL_COMMUNITY): Payer: Self-pay

## 2020-10-25 NOTE — Telephone Encounter (Signed)
[facility] Patient Advocate Encounter ? ?Insurance verification completed.   ? ?The patient is uninsured and will need patient assistance for medication. ? ?We can complete the application and will need to meet with the patient for signatures and income documentation. ? ?[place] Oborn, CPhT ?Specialty Pharmacy Patient Advocate ?[facility] ?Phone: [phone] ?Fax:  [phone]  ?

## 2020-12-29 ENCOUNTER — Emergency Department (HOSPITAL_COMMUNITY)
Admission: EM | Admit: 2020-12-29 | Discharge: 2020-12-29 | Discharge status: Against Medical Advice | Payer: Medicaid Other

## 2020-12-29 NOTE — ED Notes (Signed)
Patient not answering for triage vitals.
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
## 2021-05-26 IMAGING — DX DG CHEST 1V PORT
1 series · 1 of 1 positions shown · non-contrast
Comparison: [DATE] [DATE], [DATE] [DATE] a.m.

CLINICAL DATA: Shortness of breath. Evaluate for pleural effusion
or pneumothorax.

EXAM:
PORTABLE CHEST 1 VIEW

[chest ap]
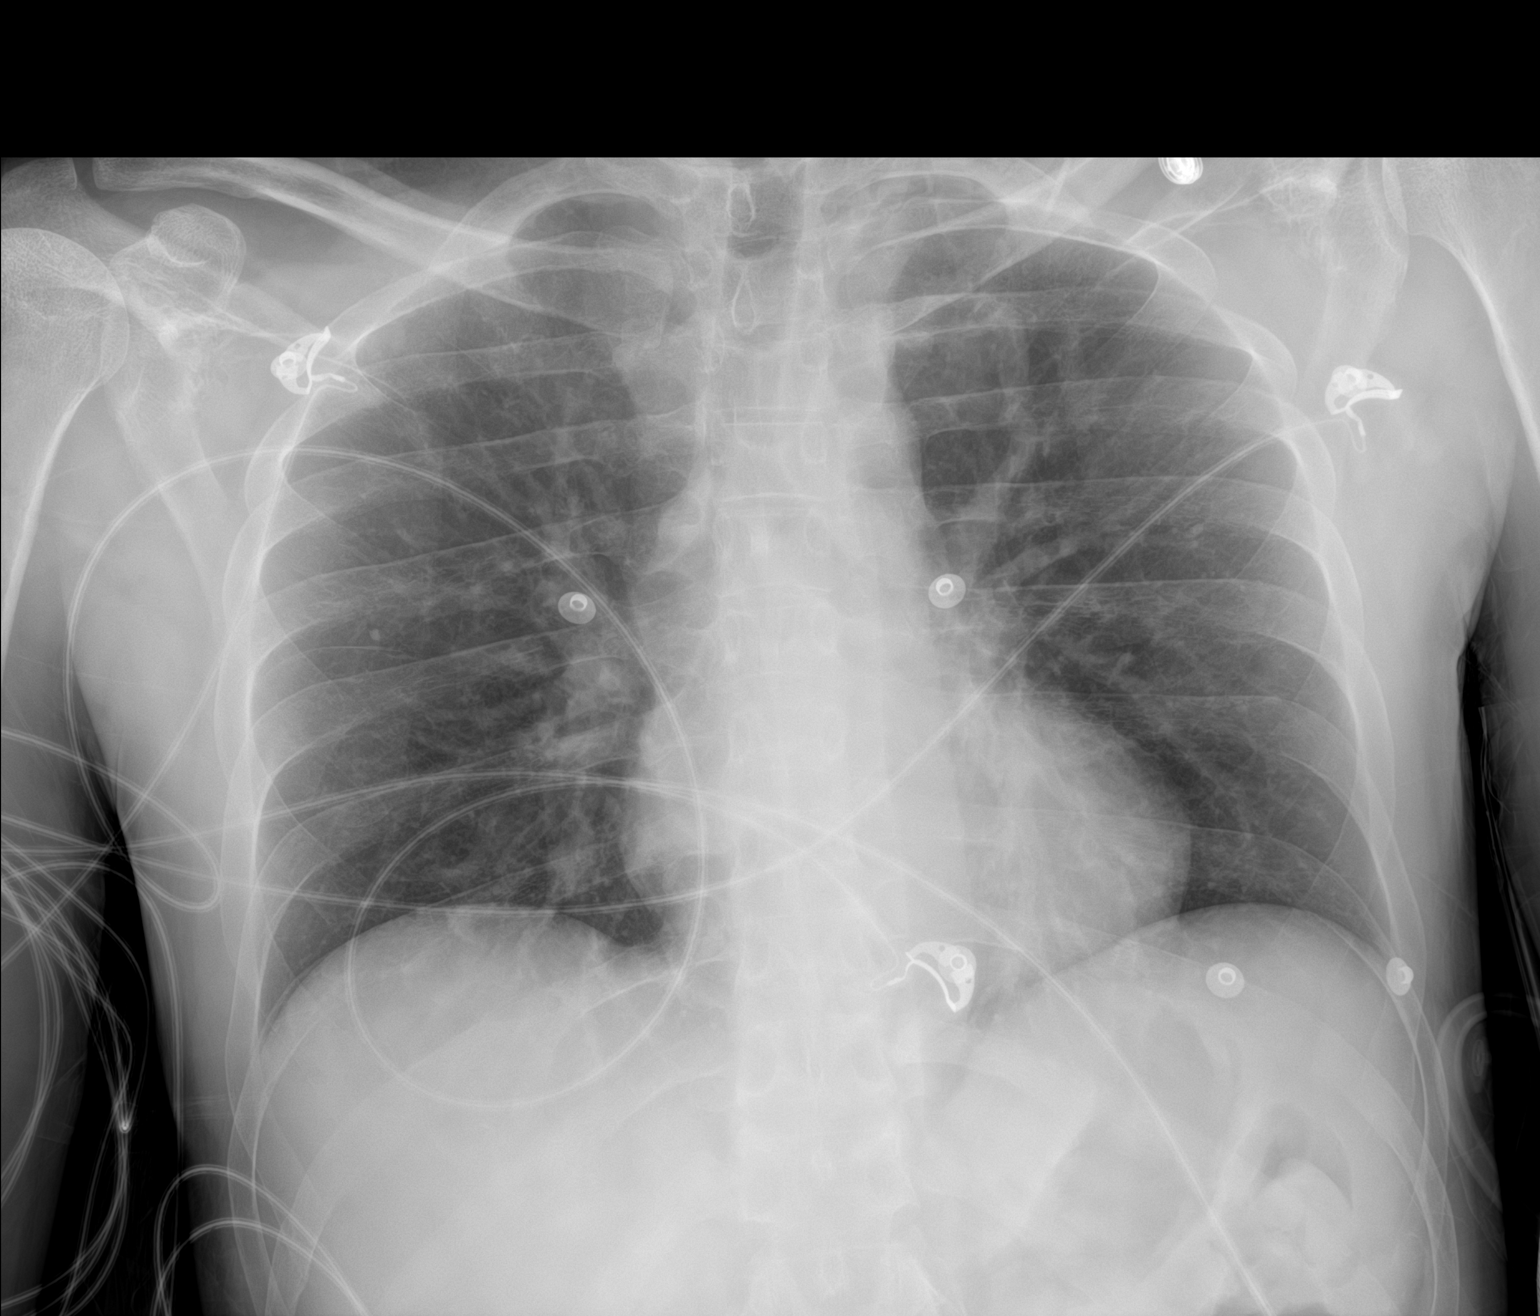

[1 of 1 positions shown; findings below may reference images not displayed]

FINDINGS: The heart size and mediastinal contours are within normal limits.
Minimal atelectasis of medial right lung base is identified. No
pneumothorax or pleural effusion are noted. The visualized skeletal
structures are unremarkable.
IMPRESSION: Minimal atelectasis of medial right lung base. No pleural effusion
or pneumothorax noted.
